# Patient Record
Sex: Female | Born: 1965 | Race: White | Hispanic: No | Marital: Married | Smoking: Former smoker
Health system: Southern US, Community
[De-identification: ages and names within clinical notes are randomized; demographics above are authoritative.]

## PROBLEM LIST (undated history)

## (undated) DIAGNOSIS — E785 Hyperlipidemia, unspecified: Secondary | ICD-10-CM

## (undated) DIAGNOSIS — H409 Unspecified glaucoma: Secondary | ICD-10-CM

## (undated) DIAGNOSIS — Z9889 Other specified postprocedural states: Secondary | ICD-10-CM

## (undated) DIAGNOSIS — R112 Nausea with vomiting, unspecified: Secondary | ICD-10-CM

## (undated) DIAGNOSIS — T8859XA Other complications of anesthesia, initial encounter: Secondary | ICD-10-CM

## (undated) DIAGNOSIS — J069 Acute upper respiratory infection, unspecified: Secondary | ICD-10-CM

## (undated) DIAGNOSIS — T4145XA Adverse effect of unspecified anesthetic, initial encounter: Secondary | ICD-10-CM

## (undated) HISTORY — PX: BREAST BIOPSY: SHX20

## (undated) HISTORY — DX: Unspecified glaucoma: H40.9

## (undated) HISTORY — DX: Acute upper respiratory infection, unspecified: J06.9

## (undated) HISTORY — DX: Hyperlipidemia, unspecified: E78.5

---
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
## 2000-02-22 ENCOUNTER — Encounter: Payer: Self-pay | Admitting: *Deleted

## 2000-02-22 ENCOUNTER — Ambulatory Visit (HOSPITAL_COMMUNITY): Admission: RE | Admit: 2000-02-22 | Discharge: 2000-02-22 | Payer: Self-pay | Admitting: *Deleted

## 2000-05-05 ENCOUNTER — Inpatient Hospital Stay (HOSPITAL_COMMUNITY): Admission: AD | Admit: 2000-05-05 | Discharge: 2000-05-07 | Payer: Self-pay | Admitting: *Deleted

## 2000-06-14 ENCOUNTER — Other Ambulatory Visit: Admission: RE | Admit: 2000-06-14 | Discharge: 2000-06-14 | Payer: Self-pay | Admitting: *Deleted

## 2000-06-23 ENCOUNTER — Encounter (INDEPENDENT_AMBULATORY_CARE_PROVIDER_SITE_OTHER): Payer: Self-pay

## 2000-06-23 ENCOUNTER — Other Ambulatory Visit: Admission: RE | Admit: 2000-06-23 | Discharge: 2000-06-23 | Payer: Self-pay | Admitting: *Deleted

## 2001-07-04 ENCOUNTER — Other Ambulatory Visit: Admission: RE | Admit: 2001-07-04 | Discharge: 2001-07-04 | Payer: Self-pay | Admitting: *Deleted

## 2002-07-31 ENCOUNTER — Other Ambulatory Visit: Admission: RE | Admit: 2002-07-31 | Discharge: 2002-07-31 | Payer: Self-pay | Admitting: *Deleted

## 2002-10-04 ENCOUNTER — Encounter (INDEPENDENT_AMBULATORY_CARE_PROVIDER_SITE_OTHER): Payer: Self-pay

## 2002-10-04 ENCOUNTER — Ambulatory Visit (HOSPITAL_COMMUNITY): Admission: RE | Admit: 2002-10-04 | Discharge: 2002-10-04 | Payer: Self-pay | Admitting: *Deleted

## 2003-06-18 ENCOUNTER — Encounter: Admission: RE | Admit: 2003-06-18 | Discharge: 2003-06-18 | Payer: Self-pay | Admitting: *Deleted

## 2004-01-12 ENCOUNTER — Encounter: Admission: RE | Admit: 2004-01-12 | Discharge: 2004-01-12 | Payer: Self-pay | Admitting: *Deleted

## 2004-08-05 ENCOUNTER — Encounter: Admission: RE | Admit: 2004-08-05 | Discharge: 2004-08-05 | Payer: Self-pay | Admitting: *Deleted

## 2004-11-17 ENCOUNTER — Encounter: Admission: RE | Admit: 2004-11-17 | Discharge: 2004-11-17 | Payer: Self-pay | Admitting: *Deleted

## 2006-03-21 ENCOUNTER — Encounter: Admission: RE | Admit: 2006-03-21 | Discharge: 2006-03-21 | Payer: Self-pay | Admitting: *Deleted

## 2008-02-15 HISTORY — PX: WRIST FRACTURE SURGERY: SHX121

## 2008-04-17 ENCOUNTER — Encounter: Admission: RE | Admit: 2008-04-17 | Discharge: 2008-04-17 | Payer: Self-pay | Admitting: Obstetrics

## 2009-12-17 ENCOUNTER — Encounter: Admission: RE | Admit: 2009-12-17 | Discharge: 2009-12-17 | Payer: Self-pay | Admitting: Obstetrics and Gynecology

## 2009-12-30 ENCOUNTER — Encounter: Admission: RE | Admit: 2009-12-30 | Discharge: 2009-12-30 | Payer: Self-pay | Admitting: Obstetrics and Gynecology

## 2010-03-06 ENCOUNTER — Encounter: Payer: Self-pay | Admitting: *Deleted

## 2010-03-06 ENCOUNTER — Encounter: Payer: Self-pay | Admitting: Obstetrics and Gynecology

## 2010-07-02 NOTE — Op Note (Signed)
NAME:  Brenda Ashley, Brenda Ashley                         ACCOUNT NO.:  [id number]   MEDICAL RECORD NO.:  [id number]                   PATIENT TYPE:  AMB   LOCATION:  SDC                                  FACILITY:  [facility]   PHYSICIAN:   B. Earlene Plater, M.D.               DATE OF BIRTH:  [birth year]   DATE OF PROCEDURE:  10/04/2002  DATE OF DISCHARGE:                                 OPERATIVE REPORT   PREOPERATIVE DIAGNOSIS:  Vulvar carcinoma in situ.   POSTOPERATIVE DIAGNOSIS:  Vulvar carcinoma in situ.   PROCEDURE:  Wide local excision, vulvar lesion, x2 with colposcopy of the  vulva and vagina.   SURGEON:  Chester Holstein. Earlene Plater, M.D.   ANESTHESIA:  LMA.   FINDINGS:  Previous biopsy site on the superior aspect of right labia majora  with a 0.5 cm area of aceto white changes previously confirmed as carcinoma  in site with positive margin on the biopsy.  Left labia minora, inferior  portion, 0.5 cm of aceto white changes.  Both are excised with about a 1 cm  margin.   SPECIMENS:  Vulvar excision x2.   COMPLICATIONS:  None.   ESTIMATED BLOOD LOSS:  Less than 15 mL.   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  At the preop  evaluation was noted to have an additional area in two places on the labia,  one on the left and another on the right which will also be treated today.   PROCEDURE:  The patient was taken to the operating room and LMA anesthesia  obtained. She was placed in the ski position and the perineum covered in  soaked sponges with 4% acetic acid.  After being allowed to sit for about  five minutes colposcopy was performed and two areas of abnormality  identified as outlined above.  A previously noted third area was not seen  today.   Each area was outlined with adequate margin with surgical pen and the  perineum  prepped and draped in standard fashion.  Each site was infiltrated  with about 5 mL of 0.25% Marcaine.   The previously marked area was excised sharply with the knife, made  hemostatic with the Bovie, and reapproximated with interrupted simple  sutures of 4-0 Vicryl.   The patient tolerated the procedure well.  There were no complications.  She  was taken to the recovery room awake, alert, and in stable condition.                                               Gerri Spore B. Earlene Plater, M.D.    WBD/MEDQ  D:  10/04/2002  T:  10/05/2002  Job:  [id number]

## 2010-07-02 NOTE — H&P (Signed)
NAME:  Brenda Ashley, Brenda Ashley                         ACCOUNT NO.:  [id number]   MEDICAL RECORD NO.:  [id number]                   PATIENT TYPE:  AMB   LOCATION:  SDC                                  [facility]:  [facility]   PHYSICIAN:  Tacna B. Earlene Plater, M.D.               DATE OF BIRTH:  07/05/1965   DATE OF ADMISSION:  DATE OF DISCHARGE:                                HISTORY & PHYSICAL   DATE OF PROCEDURE:  October 04, 2002   PREOPERATIVE DIAGNOSIS:  Vulvar carcinoma in situ.   INTENDED PROCEDURE:  Wide local excision of multifocal vulvar neoplasia.   HISTORY OF PRESENT ILLNESS:  A 45 year old white female gravida 2 para 2,  history of VIN 3, has been followed in the office every six months for any  evidence of recurrence.  Most recently seen for annual exam in June 2004.  An area on the right labia majora was subsequently excised, final pathology  showing squamous carcinoma in situ.  The patient returned to the office for  a colposcopy and was found to have multifocal lesions on the right and left  labia minora and therefore presents for wide local excision in the operating  room.   PAST MEDICAL HISTORY:  1. Depression - one episode.  2. Vulvar CIS.  3. Hemorrhoids.   OBSTETRICAL HISTORY:  Vaginal delivery x2.   SURGICAL HISTORY:  1. Multiple wide local excisions of the vulva.  2. Cold knife conization for cervical dysplasia.   MEDICATIONS:  1. Paxil 20 mg one-half tablet daily.  2. Multivitamin.   ALLERGIES:  CODEINE - nausea and vomiting.   SOCIAL HISTORY:  No alcohol, tobacco, or other drugs.   FAMILY HISTORY:  Noncontributory.   REVIEW OF SYSTEMS:  Otherwise negative.   PHYSICAL EXAMINATION:  VITAL SIGNS:  Blood pressure 112/80, pulse 80, weight  118.  GENERAL:  Alert and oriented, no acute distress.  SKIN:  Warm and dry, no lesions.  HEART:  Regular rate and rhythm.  LUNGS:  Clear to auscultation.  ABDOMEN:  Liver and spleen normal, no hernia.  LYMPH NODE SURVEY:   Negative at the neck, axilla, and groin.  PELVIC:  Normal external genitalia other than a 3 mm visible lesion superior  aspect of the right labia majora which was biopsied in the office and was  CIS.  Subsequent colposcopy confirmed this area to be acetowhite.  Also  noted areas just lateral to the previous biopsy site and another between the  vagina and labia minora on the left.   ASSESSMENT:  Multifocal vulvar neoplasia and biopsy-proven vulvar carcinoma  in situ.   PLAN:  Wide local excision of each area of concern with colposcopy.  Operative risks discussed including infection, bleeding, delayed healing.  All questions answered; the patient wished to proceed.  Gerri Spore B. Earlene Plater, M.D.    WBD/MEDQ  D:  10/02/2002  T:  10/02/2002  Job:  [id number]

## 2010-07-02 NOTE — Op Note (Signed)
[facility]  Patient:    Brenda Ashley, Brenda Ashley                      MRN: [id number] Proc. Date: 05/05/00 Adm. Date:  [id number] Attending:  Maxie Better                           Operative Report  PREOPERATIVE DIAGNOSES:       1. Term intrauterine pregnancy.                               2. Maternal exhaustion.  POSTOPERATIVE DIAGNOSES:      1. Term intrauterine pregnancy, delivered.                               2. Maternal exhaustion.  PROCEDURE:                    Low forceps delivery with Luikart forceps.  SURGEON:                      Marina Gravel, M.D.  ANESTHESIA:                   Epidural.  INDICATIONS:                  The patient presented in spontaneous labor on the day of her planned induction.  She progressed with Pitocin augmentation to complete, complete, +3, 5 degree LOA position.  She had pushed for 90 minutes and had been complete for 2-1/2 hours with complaints of exhaustion and requesting assistance.  The risks and benefits were discussed and documented on the chart.  The patient opted for low forceps delivery.  DESCRIPTION OF PROCEDURE:     After adequate epidural anesthesia was obtained, the patient was placed in the dorsal lithotomy position, prepped and draped in the standard fashion.  The bladder was emptied with a red rubber catheter.  The patient was re-examined under anesthesia and again found to be complete, complete, +3, 5 degrees of rotation to LOA position.  Luikart forceps were then applied with ease.  Appropriate placement was confirmed by equidistant spacing from the toe of each forceps blade to the lambdoid suture and by being centered over the sagittal suture in the midline.  With a single traction with the next maternal contraction, excellent descent was noted and the vertex was delivered to the perineum.  The forceps blades were removed and the remainder of the vertex delivered spontaneously.  The nose  and mouth were suctioned with a bulb.  The remainder of the infant was delivered without difficulty.  A posterior arm compound presentation was noted.  This reduced spontaneously.  The cord was clamped and cut and the infant placed on the mothers belly.  Initially, hypotonia was noted.  However, good heart rate.  With stimulation, tone and color improved.  A viable female infant with Apgars of 5 and 8 was delivered at 1957 hours.  After approximately 15 minutes of cord traction and uterine massage, there was no progress with regard to placental expulsion.  Therefore, it was manually extracted.  No problems with uterine atony were observed.  The perineum was noted to have a second degree laceration.  This was repaired in two layers with 2-0 and 3-0  chromic.  The mother and her fetus tolerated the procedure well.  There were no complications.  There was no apparent injury to the fetus. DD:  05/05/00 TD:  05/08/00 Job: [id number]

## 2010-07-02 NOTE — H&P (Signed)
[facility]  Patient:    Brenda Ashley, Brenda Ashley                        MRN: Adm. Date:  05/05/00 Attending:  Marina Gravel, M.D.                         History and Physical  SOCIAL SECURITY NUMBER:       [id number]  DATE OF BIRTH:                12/29/1965  CHIEF COMPLAINT:              Induction of labor.  HISTORY OF PRESENT ILLNESS:   A 45 year old white female gravida 2 para 1 at 5 and three-sevenths weeks admitted with favorable cervix for induction of labor at term.  Prenatal care at [facility], Dr. Earlene Plater, uncomplicated. Has a history of cervical dysplasia and vulvar neoplasia.  Status post conization in 1997 and history of multiple wide local excisions of the vulva. Patient transferred her care to [facility] at 27 weeks after moving from [place].  Prior to that point she had had a 17-week ultrasound which confirmed her dates and had declined AFP.  PAST OBSTETRICAL HISTORY:     A 39-week SVD, no complications, but did have history of postpartum depression.  PAST MEDICAL HISTORY:         None.  PAST SURGICAL HISTORY:        Cold-knife conization for cervical high-grade dysplasia.  History of vulvar excision for neoplasia x 2.  ALLERGIES:                    None.  MEDICATIONS:                  Prenatal vitamins.  PRENATAL LABORATORY:          O positive.  Rubella immune.  RPR and hepatitis B negative.  AFP declined.  HIV declined.  Group B strep negative.  DATING CRITERIA:              Last menstrual period Jul 11, 1999 with [facility] April 16, 2000.  EDC changed based on 17-week ultrasound to April 30, 2000.  PHYSICAL EXAMINATION:  VITAL SIGNS:                  Weight 145, blood pressure 116/70, fetal heart rate 136.  GENERAL:                      The patient is alert and oriented in no acute distress.  SKIN:                         Warm and dry, no lesions.  HEART:                        Regular rate and rhythm.  LUNGS:                         Clear to auscultation.  ABDOMEN:                      Gravid,  fundal height 36.  PELVIC:  Normal external female genitalia.  Scarring from previous vulvar excisions noted.  Cervix is 3 cm dilated, 50% effaced, -1 station, vertex presentation.  IMAGING:                      Ultrasound April 20, 2000 with estimated fetal weight 3217 g, 38th percentile, with normal AFI.  REVIEW OF SYSTEMS:            Otherwise negative.  ASSESSMENT:                   Term intrauterine pregnancy, history of previous SVD, with favorable cervix.  PLAN:                         Admit for artificial rupture of membranes and anticipate spontaneous vaginal delivery. DD:  05/02/00 TD:  05/03/00 Job: [id number]

## 2010-12-13 ENCOUNTER — Other Ambulatory Visit: Payer: Self-pay | Admitting: Obstetrics and Gynecology

## 2010-12-13 DIAGNOSIS — Z1231 Encounter for screening mammogram for malignant neoplasm of breast: Secondary | ICD-10-CM

## 2011-01-07 ENCOUNTER — Ambulatory Visit
Admission: RE | Admit: 2011-01-07 | Discharge: 2011-01-07 | Disposition: A | Discharge status: Home / Self Care | Payer: 59 | Source: Ambulatory Visit | Attending: Obstetrics and Gynecology | Admitting: Obstetrics and Gynecology

## 2011-01-07 DIAGNOSIS — Z1231 Encounter for screening mammogram for malignant neoplasm of breast: Secondary | ICD-10-CM

## 2012-01-19 ENCOUNTER — Other Ambulatory Visit: Payer: Self-pay

## 2012-01-26 ENCOUNTER — Other Ambulatory Visit: Payer: Self-pay

## 2012-01-31 ENCOUNTER — Other Ambulatory Visit: Payer: Self-pay | Admitting: Obstetrics and Gynecology

## 2012-01-31 DIAGNOSIS — Z1231 Encounter for screening mammogram for malignant neoplasm of breast: Secondary | ICD-10-CM

## 2012-03-08 ENCOUNTER — Ambulatory Visit
Admission: RE | Admit: 2012-03-08 | Discharge: 2012-03-08 | Disposition: A | Discharge status: Home / Self Care | Payer: 59 | Source: Ambulatory Visit | Attending: Obstetrics and Gynecology | Admitting: Obstetrics and Gynecology

## 2012-03-08 DIAGNOSIS — Z1231 Encounter for screening mammogram for malignant neoplasm of breast: Secondary | ICD-10-CM

## 2013-03-12 ENCOUNTER — Other Ambulatory Visit: Payer: Self-pay

## 2013-03-12 DIAGNOSIS — Z1231 Encounter for screening mammogram for malignant neoplasm of breast: Secondary | ICD-10-CM

## 2013-04-01 ENCOUNTER — Ambulatory Visit
Admission: RE | Admit: 2013-04-01 | Discharge: 2013-04-01 | Disposition: A | Discharge status: Home / Self Care | Payer: Private Health Insurance - Indemnity | Source: Ambulatory Visit

## 2013-04-01 DIAGNOSIS — Z1231 Encounter for screening mammogram for malignant neoplasm of breast: Secondary | ICD-10-CM

## 2013-04-03 ENCOUNTER — Other Ambulatory Visit: Payer: Self-pay | Admitting: Obstetrics and Gynecology

## 2013-04-03 DIAGNOSIS — R928 Other abnormal and inconclusive findings on diagnostic imaging of breast: Secondary | ICD-10-CM

## 2013-04-18 ENCOUNTER — Ambulatory Visit
Admission: RE | Admit: 2013-04-18 | Discharge: 2013-04-18 | Disposition: A | Discharge status: Home / Self Care | Payer: Private Health Insurance - Indemnity | Source: Ambulatory Visit | Attending: Obstetrics and Gynecology | Admitting: Obstetrics and Gynecology

## 2013-04-18 DIAGNOSIS — R928 Other abnormal and inconclusive findings on diagnostic imaging of breast: Secondary | ICD-10-CM

## 2013-04-26 ENCOUNTER — Other Ambulatory Visit: Payer: Self-pay

## 2013-11-06 ENCOUNTER — Other Ambulatory Visit: Payer: Self-pay | Admitting: Obstetrics and Gynecology

## 2013-11-06 DIAGNOSIS — R921 Mammographic calcification found on diagnostic imaging of breast: Secondary | ICD-10-CM

## 2013-11-13 ENCOUNTER — Ambulatory Visit
Admission: RE | Admit: 2013-11-13 | Discharge: 2013-11-13 | Disposition: A | Discharge status: Home / Self Care | Payer: Managed Care, Other (non HMO) | Source: Ambulatory Visit | Attending: Obstetrics and Gynecology | Admitting: Obstetrics and Gynecology

## 2013-11-13 DIAGNOSIS — R921 Mammographic calcification found on diagnostic imaging of breast: Secondary | ICD-10-CM

## 2014-05-05 ENCOUNTER — Other Ambulatory Visit: Payer: Self-pay | Admitting: Obstetrics and Gynecology

## 2014-05-05 DIAGNOSIS — R921 Mammographic calcification found on diagnostic imaging of breast: Secondary | ICD-10-CM

## 2014-05-09 ENCOUNTER — Ambulatory Visit
Admission: RE | Admit: 2014-05-09 | Discharge: 2014-05-09 | Disposition: A | Discharge status: Home / Self Care | Payer: Managed Care, Other (non HMO) | Source: Ambulatory Visit | Attending: Obstetrics and Gynecology | Admitting: Obstetrics and Gynecology

## 2014-05-09 DIAGNOSIS — R921 Mammographic calcification found on diagnostic imaging of breast: Secondary | ICD-10-CM

## 2015-07-08 ENCOUNTER — Other Ambulatory Visit: Payer: Self-pay | Admitting: Family Medicine

## 2015-07-08 ENCOUNTER — Other Ambulatory Visit: Payer: Self-pay | Admitting: Obstetrics and Gynecology

## 2015-07-08 DIAGNOSIS — R921 Mammographic calcification found on diagnostic imaging of breast: Secondary | ICD-10-CM

## 2015-07-21 ENCOUNTER — Ambulatory Visit
Admission: RE | Admit: 2015-07-21 | Discharge: 2015-07-21 | Disposition: A | Discharge status: Home / Self Care | Payer: Managed Care, Other (non HMO) | Source: Ambulatory Visit | Attending: Internal Medicine | Admitting: Internal Medicine

## 2015-07-21 DIAGNOSIS — R921 Mammographic calcification found on diagnostic imaging of breast: Secondary | ICD-10-CM

## 2016-08-09 ENCOUNTER — Other Ambulatory Visit: Payer: Self-pay | Admitting: Obstetrics and Gynecology

## 2016-08-09 DIAGNOSIS — Z1231 Encounter for screening mammogram for malignant neoplasm of breast: Secondary | ICD-10-CM

## 2016-08-24 ENCOUNTER — Ambulatory Visit
Admission: RE | Admit: 2016-08-24 | Discharge: 2016-08-24 | Disposition: A | Discharge status: Home / Self Care | Payer: Managed Care, Other (non HMO) | Source: Ambulatory Visit | Attending: Obstetrics and Gynecology | Admitting: Obstetrics and Gynecology

## 2016-08-24 DIAGNOSIS — Z1231 Encounter for screening mammogram for malignant neoplasm of breast: Secondary | ICD-10-CM

## 2016-08-25 ENCOUNTER — Other Ambulatory Visit: Payer: Self-pay | Admitting: Obstetrics and Gynecology

## 2016-08-25 DIAGNOSIS — R928 Other abnormal and inconclusive findings on diagnostic imaging of breast: Secondary | ICD-10-CM

## 2016-08-30 ENCOUNTER — Ambulatory Visit
Admission: RE | Admit: 2016-08-30 | Discharge: 2016-08-30 | Disposition: A | Discharge status: Home / Self Care | Payer: Managed Care, Other (non HMO) | Source: Ambulatory Visit | Attending: Obstetrics and Gynecology | Admitting: Obstetrics and Gynecology

## 2016-08-30 DIAGNOSIS — R928 Other abnormal and inconclusive findings on diagnostic imaging of breast: Secondary | ICD-10-CM

## 2017-02-14 HISTORY — PX: VULVA SURGERY: SHX837

## 2017-04-26 ENCOUNTER — Encounter: Payer: Self-pay | Admitting: Family Medicine

## 2017-04-26 ENCOUNTER — Ambulatory Visit (INDEPENDENT_AMBULATORY_CARE_PROVIDER_SITE_OTHER): Payer: 59 | Admitting: Family Medicine

## 2017-04-26 VITALS — BP 134/84 | HR 76 | Ht 62.0 in | Wt 134.9 lb

## 2017-04-26 DIAGNOSIS — Z1212 Encounter for screening for malignant neoplasm of rectum: Secondary | ICD-10-CM

## 2017-04-26 DIAGNOSIS — Z85820 Personal history of malignant melanoma of skin: Secondary | ICD-10-CM

## 2017-04-26 DIAGNOSIS — Z723 Lack of physical exercise: Secondary | ICD-10-CM | POA: Diagnosis not present

## 2017-04-26 DIAGNOSIS — Z87891 Personal history of nicotine dependence: Secondary | ICD-10-CM | POA: Diagnosis not present

## 2017-04-26 DIAGNOSIS — Z8249 Family history of ischemic heart disease and other diseases of the circulatory system: Secondary | ICD-10-CM | POA: Insufficient documentation

## 2017-04-26 DIAGNOSIS — Z1211 Encounter for screening for malignant neoplasm of colon: Secondary | ICD-10-CM

## 2017-04-26 DIAGNOSIS — Z83438 Family history of other disorder of lipoprotein metabolism and other lipidemia: Secondary | ICD-10-CM | POA: Insufficient documentation

## 2017-04-26 DIAGNOSIS — Z811 Family history of alcohol abuse and dependence: Secondary | ICD-10-CM | POA: Insufficient documentation

## 2017-04-26 DIAGNOSIS — Z830 Family history of human immunodeficiency virus [HIV] disease: Secondary | ICD-10-CM | POA: Insufficient documentation

## 2017-04-26 DIAGNOSIS — Z833 Family history of diabetes mellitus: Secondary | ICD-10-CM

## 2017-04-26 DIAGNOSIS — Z818 Family history of other mental and behavioral disorders: Secondary | ICD-10-CM | POA: Insufficient documentation

## 2017-04-26 DIAGNOSIS — Z831 Family history of other infectious and parasitic diseases: Secondary | ICD-10-CM

## 2017-04-26 DIAGNOSIS — Z801 Family history of malignant neoplasm of trachea, bronchus and lung: Secondary | ICD-10-CM | POA: Insufficient documentation

## 2017-04-26 DIAGNOSIS — Z975 Presence of (intrauterine) contraceptive device: Secondary | ICD-10-CM | POA: Insufficient documentation

## 2017-04-26 NOTE — Progress Notes (Signed)
New patient office visit note:  Impression and Recommendations:    1. History of melanoma-sees Derm yearly   2. History of smoking 10 pack years   3. Screening for colorectal cancer   4. FHx: early coronary artery disease   5. Family history of diabetes mellitus (DM)-brother,   6. Family history of HIV infection- BROTHER   7. Family history of combined hyperlipidemia   8. Family history of hypertension   9. Inactivity    -Pt encouraged to exercise 30 minutes daily.  -Check labs. Pt prefers to go over results in person.  -Referral given for colonoscopy as this is not UTD.   -mediterranean diet discussed. Eat more lean proteins like chicken, Malawiturkey, and fish. Limit red meat to 1 time per week. Eat less processed foods and eat more foods that grow out of the ground, and out of a tree or bush.    Education and routine counseling performed. Handouts provided.  Orders Placed This Encounter  Procedures  . CBC with Differential/Platelet  . Comprehensive metabolic panel  . Hemoglobin A1c  . Lipid panel  . VITAMIN D 25 Hydroxy (Vit-D Deficiency, Fractures)  . T3, free  . TSH  . T4, free  . Ambulatory referral to Gastroenterology    Gross side effects, risk and benefits, and alternatives of medications discussed with patient.  Patient is aware that all medications have potential side effects and we are unable to predict every side effect or drug-drug interaction that may occur.  Expresses verbal understanding and consents to current therapy plan and treatment regimen.  Return for Chronic OV w me near future & FBW 2-3d prior.  Patient desires to come back and discuss results with me    Please see AVS handed out to patient at the end of our visit for further patient instructions/ counseling done pertaining to today's office visit.    Note: This document was prepared using Dragon voice recognition software and may include unintentional dictation errors.   Pt was in the  office today for 60+ minutes, with over 50% time spent in face to face counseling of patients various medical conditions, treatment plans of those medical conditions including medicine management and lifestyle modification, strategies to improve health and well being; and in coordination of care. SEE ABOVE FOR DETAILS  This document serves as a record of services personally performed by [name], DO. It was created on her behalf by Thelma BargeNick Cochran, a trained medical scribe. The creation of this record is based on the scribe's personal observations and the provider's statements to them.   I have reviewed the above medical documentation for accuracy and completeness and I concur.  [name] 04/26/17 7:19 PM  ----------------------------------------------------------------------------------------------------------------------    Subjective:    Chief complaint:   Chief Complaint  Patient presents with  . Establish Care     HPI: Brenda Ashley is a pleasant 52 y.o. female who [facility] to review their medical history with me and establish care.   I asked the patient to review their chronic problem list with me to ensure everything was updated and accurate.    All recent office visits with other providers, any medical records that patient brought in etc  - I reviewed today.     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  Told them to call their specialists to clarify this if they are not sure.   Personal information She has lived in [place] her whole life despite being born in [place].  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   She has 1 daughter age 37, and 1 son age 53, freshman at [employer]  in math.   Exercise:  she does not exercise recently, but she has a new elliptical being sent to her house next week. She tries to walk 10,000 steps/day.   Diet: She is working on her diet slowly  Other providers She has not had a PCP before. A friend of hers was referring her to come to the clinic here because she was looking for a holistic head-to-toe doctor.   OBGYN- Dr. Clovis Pu, [place], [place]- they have been doing screenings and blood works. She is with [facility]- [facility]  PMHx Mirena IUD placed through GYN.  Melanoma on back- over 5 years ago, 2013 (which she had removed)- which she had chest Xray and other imaging with WNL results.  No h/o DM, HLD, HTN etc. to her knowledge.  Colonoscopy not UTD- she states she gets sick easily and is worried about the preparation.  FMHx Mother - Lung CA, age early 52s- not a daily smoker (but second hand exposure) Father - MI, age early 33s- he committed suicide 80. Brother died of AIDS 56 (age 55) Paternal uncle - MI, age  Older brother- DM2 age 33 onset, alcoholism, Below knee amputation as a result of traumatic injury.  HLD- Brother, father   PSHx   SHx She quit smoking in 1993 and smoked for 10 years (~1 pack/day). She drinks socially 1 drink per month.   Wt Readings from Last 3 Encounters:  04/26/17 134 lb 14.4 oz (61.2 kg)   BP Readings from Last 3 Encounters:  04/26/17 134/84   Pulse Readings from Last 3 Encounters:  04/26/17 76   BMI Readings from Last 3 Encounters:  04/26/17 24.67 kg/m    Patient Care Team    Relationship Specialty Notifications Start End  Thomasene Lot, DO PCP - General Family Medicine  04/26/17   Bufford Buttner, MD Referring Physician Dermatology  04/26/17   Ob/Gyn, Nestor Ramp    04/26/17 04/26/17   Comment: Has GYN Dr. at [facility] gynecology :   Dr.  Francene Finders, Dewitt Rota, MD  Obstetrics and Gynecology  04/26/17     Patient  Active Problem List   Diagnosis Date Noted  . History of melanoma-sees Derm yearly 04/26/2017  . History of smoking 10 pack years 04/26/2017  . Family history of lung cancer-in non-smoker 04/26/2017  . FHx: early coronary artery disease 04/26/2017  . Family history of suicide- FATHER 04/26/2017  . Family history of diabetes mellitus (DM)-brother, 04/26/2017  . Family history of HIV infection- BROTHER 04/26/2017  . Family history of combined hyperlipidemia 04/26/2017  . Family history of hypertension 04/26/2017  . IUD (intrauterine device) in place-Per GYN 04/26/2017  . Inactivity 04/26/2017  . Family history of alcoholism 04/26/2017     History reviewed. No pertinent past medical history.   History reviewed. No pertinent past medical history.   Past Surgical History:  Procedure Laterality Date  . WRIST FRACTURE SURGERY  2010     Family History  Problem Relation Age of Onset  . Breast cancer Maternal Aunt   .  Breast cancer Cousin   . Lung cancer Mother   . Heart attack Father   . Depression Father   . Hyperlipidemia Father   . Diabetes Brother   . Hyperlipidemia Brother      Social History   Substance and Sexual Activity  Drug Use No     Social History   Substance and Sexual Activity  Alcohol Use Yes   Comment: 1/ month     Social History   Tobacco Use  Smoking Status Former Smoker  . Packs/day: 0.50  . Years: 15.00  . Pack years: 7.50  . Types: Cigarettes  . Last attempt to quit: 1993  . Years since quitting: 26.2  Smokeless Tobacco Never Used     No outpatient medications have been marked as taking for the 04/26/17 encounter (Office Visit) with Thomasene Lot, DO.    Allergies: Patient has no known allergies.   Review of Systems  Constitutional: Negative for chills, diaphoresis, fever, malaise/fatigue and weight loss.  HENT: Negative for congestion, sore throat and tinnitus.   Eyes: Negative for blurred vision, double vision and  photophobia.  Respiratory: Negative for cough and wheezing.   Cardiovascular: Negative for chest pain and palpitations.  Gastrointestinal: Negative for blood in stool, diarrhea, nausea and vomiting.  Genitourinary: Negative for dysuria, frequency and urgency.  Musculoskeletal: Negative for joint pain and myalgias.  Skin: Negative for itching and rash.  Neurological: Negative for dizziness, focal weakness, weakness and headaches.  Endo/Heme/Allergies: Negative for environmental allergies and polydipsia. Does not bruise/bleed easily.  Psychiatric/Behavioral: Negative for depression and memory loss. The patient is not nervous/anxious and does not have insomnia.      Objective:   Blood pressure 134/84, pulse 76, height 5\' 2"  (1.575 m), weight 134 lb 14.4 oz (61.2 kg), SpO2 100 %. Body mass index is 24.67 kg/m. General: Well Developed, well nourished, and in no acute distress.  Neuro: Alert and oriented x3, extra-ocular muscles intact, sensation grossly intact.  HEENT:[place]/AT, PERRLA, neck supple, No carotid bruits Skin: no gross rashes  Cardiac: Regular rate and rhythm Respiratory: Essentially clear to auscultation bilaterally. Not using accessory muscles, speaking in full sentences.  Abdominal: not grossly distended Musculoskeletal: Ambulates w/o diff, FROM * 4 ext.  Vasc: less 2 sec cap RF, warm and pink  Psych:  No HI/SI, judgement and insight good, Euthymic mood. Full Affect.    No results found for this or any previous visit (from the past 2160 hour(s)).

## 2017-04-26 NOTE — Patient Instructions (Signed)

## 2017-04-28 ENCOUNTER — Other Ambulatory Visit (INDEPENDENT_AMBULATORY_CARE_PROVIDER_SITE_OTHER): Payer: 59

## 2017-04-28 DIAGNOSIS — Z87891 Personal history of nicotine dependence: Secondary | ICD-10-CM

## 2017-04-28 DIAGNOSIS — Z1212 Encounter for screening for malignant neoplasm of rectum: Secondary | ICD-10-CM

## 2017-04-28 DIAGNOSIS — Z1211 Encounter for screening for malignant neoplasm of colon: Secondary | ICD-10-CM

## 2017-04-28 DIAGNOSIS — Z85820 Personal history of malignant melanoma of skin: Secondary | ICD-10-CM

## 2017-04-29 LAB — CBC WITH DIFFERENTIAL/PLATELET
BASOS: 0 %
Basophils Absolute: 0 10*3/uL (ref 0.0–0.2)
EOS (ABSOLUTE): 0 10*3/uL (ref 0.0–0.4)
EOS: 1 %
HEMATOCRIT: 40.2 % (ref 34.0–46.6)
Hemoglobin: 14.4 g/dL (ref 11.1–15.9)
IMMATURE GRANS (ABS): 0 10*3/uL (ref 0.0–0.1)
Immature Granulocytes: 0 %
Lymphocytes Absolute: 2.1 10*3/uL (ref 0.7–3.1)
Lymphs: 38 %
MCH: 32.4 pg (ref 26.6–33.0)
MCHC: 35.8 g/dL — ABNORMAL HIGH (ref 31.5–35.7)
MCV: 91 fL (ref 79–97)
MONOS ABS: 0.6 10*3/uL (ref 0.1–0.9)
Monocytes: 11 %
NEUTROS ABS: 2.8 10*3/uL (ref 1.4–7.0)
Neutrophils: 50 %
PLATELETS: 207 10*3/uL (ref 150–379)
RBC: 4.44 x10E6/uL (ref 3.77–5.28)
RDW: 12.6 % (ref 12.3–15.4)
WBC: 5.5 10*3/uL (ref 3.4–10.8)

## 2017-04-29 LAB — COMPREHENSIVE METABOLIC PANEL
A/G RATIO: 1.9 (ref 1.2–2.2)
ALBUMIN: 4.5 g/dL (ref 3.5–5.5)
ALT: 9 IU/L (ref 0–32)
AST: 17 IU/L (ref 0–40)
Alkaline Phosphatase: 54 IU/L (ref 39–117)
BUN/Creatinine Ratio: 21 (ref 9–23)
BUN: 14 mg/dL (ref 6–24)
Bilirubin Total: 0.8 mg/dL (ref 0.0–1.2)
CALCIUM: 9 mg/dL (ref 8.7–10.2)
CO2: 24 mmol/L (ref 20–29)
Chloride: 104 mmol/L (ref 96–106)
Creatinine, Ser: 0.68 mg/dL (ref 0.57–1.00)
GFR, EST AFRICAN AMERICAN: 117 mL/min/{1.73_m2} (ref >59)
GFR, EST NON AFRICAN AMERICAN: 102 mL/min/{1.73_m2} (ref >59)
GLOBULIN, TOTAL: 2.4 g/dL (ref 1.5–4.5)
Glucose: 90 mg/dL (ref 65–99)
POTASSIUM: 4.7 mmol/L (ref 3.5–5.2)
SODIUM: 142 mmol/L (ref 134–144)
TOTAL PROTEIN: 6.9 g/dL (ref 6.0–8.5)

## 2017-04-29 LAB — LIPID PANEL
CHOLESTEROL TOTAL: 181 mg/dL (ref 100–199)
Chol/HDL Ratio: 2.6 ratio (ref 0.0–4.4)
HDL: 70 mg/dL (ref >39)
LDL Calculated: 102 mg/dL — ABNORMAL HIGH (ref 0–99)
TRIGLYCERIDES: 43 mg/dL (ref 0–149)
VLDL Cholesterol Cal: 9 mg/dL (ref 5–40)

## 2017-04-29 LAB — HEMOGLOBIN A1C
Est. average glucose Bld gHb Est-mCnc: 103 mg/dL
Hgb A1c MFr Bld: 5.2 % (ref 4.8–5.6)

## 2017-04-29 LAB — VITAMIN D 25 HYDROXY (VIT D DEFICIENCY, FRACTURES): VIT D 25 HYDROXY: 24 ng/mL — AB (ref 30.0–100.0)

## 2017-04-29 LAB — T4, FREE: Free T4: 1.22 ng/dL (ref 0.82–1.77)

## 2017-04-29 LAB — T3, FREE: T3, Free: 2.9 pg/mL (ref 2.0–4.4)

## 2017-04-29 LAB — TSH: TSH: 1.11 u[IU]/mL (ref 0.450–4.500)

## 2017-05-01 ENCOUNTER — Encounter: Payer: Self-pay | Admitting: Family Medicine

## 2017-05-01 ENCOUNTER — Ambulatory Visit (INDEPENDENT_AMBULATORY_CARE_PROVIDER_SITE_OTHER): Payer: 59 | Admitting: Family Medicine

## 2017-05-01 VITALS — BP 131/86 | HR 66 | Ht 62.0 in | Wt 136.0 lb

## 2017-05-01 DIAGNOSIS — E559 Vitamin D deficiency, unspecified: Secondary | ICD-10-CM | POA: Diagnosis not present

## 2017-05-01 MED ORDER — VITAMIN D3 125 MCG (5000 UT) PO TABS: ORAL_TABLET | ORAL | 3 refills | Status: AC

## 2017-05-01 NOTE — Patient Instructions (Addendum)
Please contact your medical insurance and ask about if they do any kind of screening or early screening for lung cancer with your family history.  Asked him if there is any conditions where it would be covered for us to get a CT scan low-dose to screen you early.      Guidelines for a Low Cholesterol, Low Saturated Fat Diet   Fats - Limit total intake of fats and oils. - Avoid butter, stick margarine, shortening, lard, palm and coconut oils. - Limit mayonnaise, salad dressings, gravies and sauces, unless they are homemade with low-fat ingredients. - Limit chocolate. - Choose low-fat and nonfat products, such as low-fat mayonnaise, low-fat or non-hydrogenated peanut butter, low-fat or fat-free salad dressings and nonfat gravy. - Use vegetable oil, such as canola or olive oil. - Look for margarine that does not contain trans fatty acids. - Use nuts in moderate amounts. - Read ingredient labels carefully to determine both amount and type of fat present in foods. Limit saturated and trans fats! - Avoid high-fat processed and convenience foods.  Meats and Meat Alternatives - Choose fish, chicken, Malawiturkey and lean meats. - Use dried beans, peas, lentils and tofu. - Limit egg yolks to three to four per week. - If you eat red meat, limit to no more than three servings per week and choose loin or round cuts. - Avoid fatty meats, such as bacon, sausage, franks, luncheon meats and ribs. - Avoid all organ meats, including liver.  Dairy - Choose nonfat or low-fat milk, yogurt and cottage cheese. - Most cheeses are high in fat. Choose cheeses made from non-fat milk, such as mozzarella and ricotta cheese. - Choose light or fat-free cream cheese and sour cream. - Avoid cream and sauces made with cream.  Fruits and Vegetables - Eat a wide variety of fruits and vegetables. - Use lemon juice, vinegar or "mist" olive oil on vegetables. - Avoid adding sauces, fat or oil to vegetables.  Breads,  Cereals and Grains - Choose whole-grain breads, cereals, pastas and rice. - Avoid high-fat snack foods, such as granola, cookies, pies, pastries, doughnuts and croissants.  Cooking Tips - Avoid deep fried foods. - Trim visible fat off meats and remove skin from poultry before cooking. - Bake, broil, boil, poach or roast poultry, fish and lean meats. - Drain and discard fat that drains out of meat as you cook it. - Add little or no fat to foods. - Use vegetable oil sprays to grease pans for cooking or baking. - Steam vegetables. - Use herbs or no-oil marinades to flavor foods.

## 2017-05-01 NOTE — Progress Notes (Signed)
Assessment and plan:  1. Vitamin D deficiency    1. Lab work reviewed in detail with the patient today.  1. Lab Review  Blood Counts - White blood cells look good. - Hemoglobin hematocrit look good. - Platelet counts look good. - Premature and mature blood cells look good.  Thyroid - T3 and T4, and TSH all look good.  Vitamin D - Low at 24.0 - advised to be in the 50-60 range. - Reviewed that low Vitamin D can cause feeling tired, sluggish, depressed mood, achy muscles & joints. - Reviewed to eat things high in Vitamin D (fortified foods), or begin Vitamin D tablet. - Patient willing to begin taking Vitamin D OTC or by prescription. - Patient notes more easily remembering to take pills daily - 5000 IU's daily. - Re-check Vitamin D in 4-6 months.  Cholesterol - Reviewed the difference between good and bad cholesterol - HDL and LDL. - HDL greater than 60 is good, and her value is 70. - Reviewed that exercise can contribute to increasing HDL.  - Triglycerides = 43. - LDL is 102, near perfect being 99 or less. The 10-year ASCVD risk score Denman George DC Montez Hageman., et al., 2013) is: 0.9%   Values used to calculate the score:     Age: 54 years     Sex: Female     Is Non-Hispanic African American: No     Diabetic: No     Tobacco smoker: No     Systolic Blood Pressure: 131 mmHg     Is BP treated: No     HDL Cholesterol: 70 mg/dL     Total Cholesterol: 181 mg/dL  - Reviewed that this is a very low risk for stroke or CVA.  - Cutting back on saturated/trans fat intake, and exercising more, will help reduce this risk.  - Informational handout on cholesterol provided today.  A1C - Today is 5.2 - which we reviewed is a good number.  Kidney Function - Reviewed that the glucose reading in her lab is based on a single instant in time, the glucose she ate the night before.  Her sugar was at 90, which is under 100, which is good.  - Serum creatinine  is good.  - Reviewed critical importance of hydration in the organs, especially the kidneys.  Electrolytes  - All look fantastic.  Liver Function - Liver enzymes perfect.  2. Vitamin D Deficiency - Began Vitamin D supplementation - 5000 IU's daily.  3. General Health Maintenance Explained to patient what BMI refers to, and what it means medically.    Told patient to think about it as a "medical risk stratification measurement" and how increasing BMI is associated with increasing risk/ or worsening state of various diseases such as hypertension, hyperlipidemia, diabetes, premature OA, depression etc.  American Heart Association guidelines for healthy diet, basically Mediterranean diet, and exercise guidelines of 30 minutes 5 days per week or more discussed in detail.  Health counseling performed.  All questions answered.  - Advised patient to continue working toward exercising to improve health.    - Patient may begin with 15 minutes of activity daily.  Recommended that the patient eventually strive for at least 150 minutes of cardio per week according to guidelines established by the [facility].   - Healthy dietary habits encouraged, including low-carb, and high amounts of lean protein in diet.   - Patient should also consume adequate amounts of water - half of body  weight in oz of water per day  4. Follow-Up - Patient plans to begin using her elliptical machine, eating less carbs, drinking more water, and working on her general health maintenance.  - Patient desires a CT scan of her lung.  Advised to call insurance and describe family history.  Explain the circumstances and ask if there is any condition in which insurance would pay for her early screening CT scan.  She could also call around and ask what the cash price would be for a low-dose CT scan of the lungs.  - Later on down the line, we can refer the patient to pulmonology.  Sit for 15-20 minutes quietly with her feet on the  ground, not had caffeine or anything spicy, or a fight that got her fired up.  Then check blood pressure.  Goal is 120/80 or less, and if it remains up, she will return for follow-up sooner.  Exercising more, cutting back on salt, drinking more water, etc, all will make her blood pressure go down.  - Return in 6 months for a physical exam.   Education and routine counseling performed. Handouts provided.   Meds ordered this encounter  Medications  . Cholecalciferol (VITAMIN D3) 5000 units TABS    Sig: 5,000 IU OTC vitamin D3 daily.    Dispense:  90 tablet    Refill:  3     Return in about 6 months (around 11/01/2017) for physical exam- 73mo.   Anticipatory guidance and routine counseling done re: condition, txmnt options and need for follow up. All questions of patient's were answered.   Gross side effects, risk and benefits, and alternatives of medications discussed with patient.  Patient is aware that all medications have potential side effects and we are unable to predict every sideeffect or drug-drug interaction that may occur.  Expresses verbal understanding and consents to current therapy plan and treatment regiment.  Please see AVS handed out to patient at the end of our visit for additional patient instructions/ counseling done pertaining to today's office visit.  Note: This document was prepared using Dragon voice recognition software and may include unintentional dictation errors.    This document serves as a record of services personally performed by Thomasene Loteborah Quintasha Gren, DO. It was created on her behalf by Peggye FothergillKatherine Galloway, a trained medical scribe. The creation of this record is based on the scribe's personal observations and the provider's statements to them.   I have reviewed the above medical documentation for accuracy and completeness and I concur.  Thomasene LotDeborah Chayton Murata 05/08/17 5:16  PM   ----------------------------------------------------------------------------------------------------------------------  Subjective:   CC:   Brenda Ashley is a 52 y.o. female who [facility] for review and discussion of recent bloodwork that was done.  1. All recent blood work that we ordered was reviewed with patient today.  Patient was counseled on all abnormalities and we discussed dietary and lifestyle changes that could help those values (also medications when appropriate).  Extensive health counseling performed and all patient's concerns/ questions were addressed.   She has not reviewed her labs prior to her appointment today. Today's appointment was spent counseling and advising her based on her lab work. This information was largely based on results, impressions, and assessments.  With regards to her low Vitamin D, notes that she does feel tired lately.  Avoids exposure to sunlight given her past history of melanoma.  Patient would like to evaluate her eligibility for an early  CT screen for lung cancer given her family history.   Wt Readings from Last 3 Encounters:  05/01/17 136 lb (61.7 kg)  04/26/17 134 lb 14.4 oz (61.2 kg)   BP Readings from Last 3 Encounters:  05/01/17 131/86  04/26/17 134/84   Pulse Readings from Last 3 Encounters:  05/01/17 66  04/26/17 76   BMI Readings from Last 3 Encounters:  05/01/17 24.87 kg/m  04/26/17 24.67 kg/m     Patient Care Team    Relationship Specialty Notifications Start End  Thomasene Lot, DO PCP - General Family Medicine  04/26/17   Bufford Buttner, MD Referring Physician Dermatology  04/26/17   Bobette Mo, MD  Obstetrics and Gynecology  04/26/17     Full medical history updated and reviewed in the office today  Patient Active Problem List   Diagnosis Date Noted  . Vitamin D deficiency 05/01/2017  . History of melanoma-sees Derm yearly 04/26/2017  .  History of smoking 10 pack years 04/26/2017  . Family history of lung cancer-in non-smoker 04/26/2017  . FHx: early coronary artery disease 04/26/2017  . Family history of suicide- FATHER 04/26/2017  . Family history of diabetes mellitus (DM)-brother, 04/26/2017  . Family history of HIV infection- BROTHER 04/26/2017  . Family history of combined hyperlipidemia 04/26/2017  . Family history of hypertension 04/26/2017  . IUD (intrauterine device) in place-Per GYN 04/26/2017  . Inactivity 04/26/2017  . Family history of alcoholism 04/26/2017    No past medical history on file.  Past Surgical History:  Procedure Laterality Date  . WRIST FRACTURE SURGERY  2010    Social History   Tobacco Use  . Smoking status: Former Smoker    Packs/day: 0.50    Years: 15.00    Pack years: 7.50    Types: Cigarettes    Last attempt to quit: 1993    Years since quitting: 26.2  . Smokeless tobacco: Never Used  Substance Use Topics  . Alcohol use: Yes    Comment: 1/ month    Family Hx: Family History  Problem Relation Age of Onset  . Breast cancer Maternal Aunt   . Breast cancer Cousin   . Lung cancer Mother   . Heart attack Father   . Depression Father   . Hyperlipidemia Father   . Diabetes Brother   . Hyperlipidemia Brother      Medications: Current Outpatient Medications  Medication Sig Dispense Refill  . Cholecalciferol (VITAMIN D3) 5000 units TABS 5,000 IU OTC vitamin D3 daily. 90 tablet 3   No current facility-administered medications for this visit.     Allergies:  No Known Allergies   Review of Systems: General:   No F/C, wt loss Pulm:   No DIB, SOB, pleuritic chest pain Card:  No CP, palpitations Abd:  No n/v/d or pain Ext:  No inc edema from baseline  Objective:  Blood pressure 131/86, pulse 66, height 5\' 2"  (1.575 m), weight 136 lb (61.7 kg), last menstrual period 04/10/2017, SpO2 98 %. Body mass index is 24.87 kg/m. Gen:   Well NAD, A and O *3 HEENT:     [place]/AT, EOMI,  MMM Lungs:   Normal work of breathing. CTA B/L, no Wh, rhonchi Heart:   RRR, S1, S2 WNL's, no MRG Abd:   No gross distention Exts:    warm, pink,  Brisk capillary refill, warm and well perfused.  Psych:    No HI/SI, judgement and insight good, Euthymic mood. Full Affect.   Recent Results (from  the past 2160 hour(s))  T4, free     Status: None   Collection Time: 04/28/17  9:46 AM  Result Value Ref Range   Free T4 1.22 0.82 - 1.77 ng/dL  TSH     Status: None   Collection Time: 04/28/17  9:46 AM  Result Value Ref Range   TSH 1.110 0.450 - 4.500 uIU/mL  T3, free     Status: None   Collection Time: 04/28/17  9:46 AM  Result Value Ref Range   T3, Free 2.9 2.0 - 4.4 pg/mL  VITAMIN D 25 Hydroxy (Vit-D Deficiency, Fractures)     Status: Abnormal   Collection Time: 04/28/17  9:46 AM  Result Value Ref Range   Vit D, 25-Hydroxy 24.0 (L) 30.0 - 100.0 ng/mL    Comment: Vitamin D deficiency has been defined by the Institute of Medicine and an Endocrine Society practice guideline as a level of serum 25-OH vitamin D less than 20 ng/mL (1,2). The Endocrine Society went on to further define vitamin D insufficiency as a level between 21 and 29 ng/mL (2). 1. IOM (Institute of Medicine). 2010. Dietary reference    intakes for calcium and D. [place]: The    [employer]. 2. Holick MF, Binkley [place], Bischoff-Ferrari HA, et al.    Evaluation, treatment, and prevention of vitamin D    deficiency: an Endocrine Society clinical practice    guideline. JCEM. 2011 Jul; 96(7):1911-30.   Lipid panel     Status: Abnormal   Collection Time: 04/28/17  9:46 AM  Result Value Ref Range   Cholesterol, Total 181 100 - 199 mg/dL   Triglycerides 43 0 - 149 mg/dL   HDL 70 >16 mg/dL   VLDL Cholesterol Cal 9 5 - 40 mg/dL   LDL Calculated 109 (H) 0 - 99 mg/dL   Chol/HDL Ratio 2.6 0.0 - 4.4 ratio    Comment:                                   T. Chol/HDL Ratio                                              Men  Women                               1/2 Avg.Risk  3.4    3.3                                   Avg.Risk  5.0    4.4                                2X Avg.Risk  9.6    7.1                                3X Avg.Risk 23.4   11.0   Hemoglobin A1c     Status: None   Collection Time: 04/28/17  9:46 AM  Result Value Ref Range   Hgb A1c MFr Bld 5.2 4.8 - 5.6 %  Comment:          Prediabetes: 5.7 - 6.4          Diabetes: >6.4          Glycemic control for adults with diabetes: <7.0    Est. average glucose Bld gHb Est-mCnc 103 mg/dL  Comprehensive metabolic panel     Status: None   Collection Time: 04/28/17  9:46 AM  Result Value Ref Range   Glucose 90 65 - 99 mg/dL   BUN 14 6 - 24 mg/dL   Creatinine, Ser 1.61 0.57 - 1.00 mg/dL   GFR calc non Af Amer 102 >59 mL/min/1.73   GFR calc Af Amer 117 >59 mL/min/1.73   BUN/Creatinine Ratio 21 9 - 23   Sodium 142 134 - 144 mmol/L   Potassium 4.7 3.5 - 5.2 mmol/L   Chloride 104 96 - 106 mmol/L   CO2 24 20 - 29 mmol/L   Calcium 9.0 8.7 - 10.2 mg/dL   Total Protein 6.9 6.0 - 8.5 g/dL   Albumin 4.5 3.5 - 5.5 g/dL   Globulin, Total 2.4 1.5 - 4.5 g/dL   Albumin/Globulin Ratio 1.9 1.2 - 2.2   Bilirubin Total 0.8 0.0 - 1.2 mg/dL   Alkaline Phosphatase 54 39 - 117 IU/L   AST 17 0 - 40 IU/L   ALT 9 0 - 32 IU/L  CBC with Differential/Platelet     Status: Abnormal   Collection Time: 04/28/17  9:46 AM  Result Value Ref Range   WBC 5.5 3.4 - 10.8 x10E3/uL   RBC 4.44 3.77 - 5.28 x10E6/uL   Hemoglobin 14.4 11.1 - 15.9 g/dL   Hematocrit 09.6 04.5 - 46.6 %   MCV 91 79 - 97 fL   MCH 32.4 26.6 - 33.0 pg   MCHC 35.8 (H) 31.5 - 35.7 g/dL   RDW 40.9 81.1 - 91.4 %   Platelets 207 150 - 379 x10E3/uL   Neutrophils 50 Not Estab. %   Lymphs 38 Not Estab. %   Monocytes 11 Not Estab. %   Eos 1 Not Estab. %   Basos 0 Not Estab. %   Neutrophils Absolute 2.8 1.4 - 7.0 x10E3/uL   Lymphocytes Absolute 2.1 0.7 - 3.1 x10E3/uL   Monocytes  Absolute 0.6 0.1 - 0.9 x10E3/uL   EOS (ABSOLUTE) 0.0 0.0 - 0.4 x10E3/uL   Basophils Absolute 0.0 0.0 - 0.2 x10E3/uL   Immature Granulocytes 0 Not Estab. %   Immature Grans (Abs) 0.0 0.0 - 0.1 x10E3/uL

## 2017-05-03 ENCOUNTER — Ambulatory Visit: Payer: 59 | Admitting: Family Medicine

## 2017-06-30 ENCOUNTER — Encounter: Payer: Self-pay | Admitting: Family Medicine

## 2017-12-27 ENCOUNTER — Other Ambulatory Visit: Payer: Self-pay | Admitting: Family Medicine

## 2017-12-27 ENCOUNTER — Ambulatory Visit
Admission: RE | Admit: 2017-12-27 | Discharge: 2017-12-27 | Disposition: A | Discharge status: Home / Self Care | Payer: Managed Care, Other (non HMO) | Source: Ambulatory Visit

## 2017-12-27 DIAGNOSIS — Z1231 Encounter for screening mammogram for malignant neoplasm of breast: Secondary | ICD-10-CM

## 2017-12-29 ENCOUNTER — Other Ambulatory Visit: Payer: Self-pay | Admitting: Family Medicine

## 2017-12-29 DIAGNOSIS — R928 Other abnormal and inconclusive findings on diagnostic imaging of breast: Secondary | ICD-10-CM

## 2018-01-03 ENCOUNTER — Ambulatory Visit
Admission: RE | Admit: 2018-01-03 | Discharge: 2018-01-03 | Disposition: A | Discharge status: Home / Self Care | Payer: Managed Care, Other (non HMO) | Source: Ambulatory Visit | Attending: Family Medicine | Admitting: Family Medicine

## 2018-01-03 DIAGNOSIS — R928 Other abnormal and inconclusive findings on diagnostic imaging of breast: Secondary | ICD-10-CM

## 2018-08-20 ENCOUNTER — Telehealth: Payer: Self-pay

## 2018-08-20 ENCOUNTER — Telehealth: Payer: Self-pay | Admitting: Family Medicine

## 2018-08-20 DIAGNOSIS — Z20822 Contact with and (suspected) exposure to covid-19: Secondary | ICD-10-CM

## 2018-08-20 DIAGNOSIS — J441 Chronic obstructive pulmonary disease with (acute) exacerbation: Secondary | ICD-10-CM

## 2018-08-20 NOTE — Telephone Encounter (Signed)
Spoke to patient - please see phone note in chart. MPulliam, CMA/RT(R)

## 2018-08-20 NOTE — Telephone Encounter (Signed)
Patient called states she has had headache for several days along with other minor symptoms &  Would like to be tested for COVID 19--- forwarding request to medical assistant.   --Dion Body

## 2018-08-20 NOTE — Telephone Encounter (Signed)
Scheduled patient for COVID 19 test tomorrow at 11:45 at [facility].  Testing protocol reviewed.

## 2018-08-20 NOTE — Telephone Encounter (Signed)
Patient called and states that she has been having "massive" headaches off and on x 1-2 weeks.  Runny nose, sneezing, fatigue.  Patient is requesting COVID testing.  Patient denies shortness of breath. MPulliam, CMA/RT(R)

## 2018-08-21 ENCOUNTER — Other Ambulatory Visit: Payer: Managed Care, Other (non HMO)

## 2018-08-21 ENCOUNTER — Telehealth: Payer: Self-pay | Admitting: Family Medicine

## 2018-08-21 NOTE — Telephone Encounter (Signed)
Patient called states she found a COVID 19 testing site w/ a short results time  & patient wants to cancel--glh

## 2018-11-08 ENCOUNTER — Other Ambulatory Visit: Payer: Self-pay | Admitting: Family Medicine

## 2018-11-08 DIAGNOSIS — Z1231 Encounter for screening mammogram for malignant neoplasm of breast: Secondary | ICD-10-CM

## 2018-11-19 ENCOUNTER — Other Ambulatory Visit: Payer: Self-pay

## 2018-11-19 ENCOUNTER — Encounter: Payer: Self-pay | Admitting: Family Medicine

## 2018-11-19 ENCOUNTER — Ambulatory Visit (INDEPENDENT_AMBULATORY_CARE_PROVIDER_SITE_OTHER): Payer: Managed Care, Other (non HMO) | Admitting: Family Medicine

## 2018-11-19 VITALS — BP 121/84 | HR 87 | Ht 62.0 in | Wt 129.0 lb

## 2018-11-19 DIAGNOSIS — Z23 Encounter for immunization: Secondary | ICD-10-CM | POA: Diagnosis not present

## 2018-11-19 DIAGNOSIS — M545 Low back pain, unspecified: Secondary | ICD-10-CM

## 2018-11-19 DIAGNOSIS — R198 Other specified symptoms and signs involving the digestive system and abdomen: Secondary | ICD-10-CM | POA: Diagnosis not present

## 2018-11-19 DIAGNOSIS — R1031 Right lower quadrant pain: Secondary | ICD-10-CM | POA: Insufficient documentation

## 2018-11-19 DIAGNOSIS — M62830 Muscle spasm of back: Secondary | ICD-10-CM | POA: Diagnosis not present

## 2018-11-19 LAB — POCT URINALYSIS DIPSTICK
Bilirubin, UA: NEGATIVE
Glucose, UA: NEGATIVE
Ketones, UA: NEGATIVE
Leukocytes, UA: NEGATIVE
Nitrite, UA: NEGATIVE
Protein, UA: NEGATIVE
Spec Grav, UA: >=1.03 — AB (ref 1.010–1.025)
Urobilinogen, UA: 2 E.U./dL — AB
pH, UA: 6 (ref 5.0–8.0)

## 2018-11-19 NOTE — Progress Notes (Signed)
Impression and Recommendations:    1. Right lower quadrant abdominal pain   2. Pulsatile abdomen; no masses appreciated   3. Acute right-sided low back pain without sciatica   4. Muscle spasm of back   5. Need for vaccination     - Patient agrees to follow up for yearly physical in near future; come fasting just in case.  - Will hold off on labs since abdominal exam normal and patient returning to clinic in very near future for CPE with full set of fasting blood work.   RLQ Abdominal Pain, Pulsatile Abdomen Centrally, Back Pain - Extensively discussed patient's symptoms in office today. - Lengthy discussion held possible etiologies-from back pain radiating to the front, constipation and gaseous pains, nephrolithiasis, UTI, ovarian cyst and others.  Education provided and all questions answered. - Urinalysis obtained today-sent for culture as it was relatively benign.  - Discussed options for imaging today. -Patient agreed she would like to hold off with CT abdomen pelvis and proceed with abdominal ultrasound discussed and ordered today.  As first-line work-up due to decreased radiation exposure, cost etc. - Reviewed that this is a good first-line starting point for imaging. - If needed, further imaging will be obtained.   - Reviewed that patient does have muscle spasms present in the back. - Advised walking, using heating pad for muscle spasms only.  She declined muscle relaxers today. - Encouraged patient to ask her husband to gently massage the area, from neck down to back to break up fascial plane adhesions etc.  - In future, advised that patient might need to follow up with OBGYN if indicated.  - Patient knows to watch for worsening of symptoms or red flags as advised. - Patient will let clinic and OBGYN know about symptoms as advised.  - Will continue to monitor.   Orders Placed This Encounter  Procedures  . CULTURE, URINE COMPREHENSIVE  . US Abdomen Complete  .  Flu Vaccine QUAD 36+ mos IM  . Tdap vaccine greater than or equal to 7yo IM  . POCT urinalysis dipstick    Gross side effects, risk and benefits, and alternatives of medications and treatment plan in general discussed with patient.  Patient is aware that all medications have potential side effects and we are unable to predict every side effect or drug-drug interaction that may occur.   Patient will call with any questions prior to using medication if they have concerns.    Expresses verbal understanding and consents to current therapy and treatment regimen.  No barriers to understanding were identified.  Red flag symptoms and signs discussed in detail.  Patient expressed understanding regarding what to do in case of emergency\urgent symptoms  Please see AVS handed out to patient at the end of our visit for further patient instructions/ counseling done pertaining to today's office visit.   Return for yearly physical in near future; come fasting just in case.     Note:  This note was prepared with assistance of Dragon voice recognition software. Occasional wrong-word or sound-a-like substitutions may have occurred due to the inherent limitations of voice recognition software.   This document serves as a record of services personally performed by Mellody Dance, DO. It was created on her behalf by Toni Amend, a trained medical scribe. The creation of this record is based on the scribe's personal observations and the provider's statements to them.   I have reviewed the above medical documentation for accuracy and completeness and  I concur.  Thomasene Lot, DO 11/19/2018 5:52 PM        --------------------------------------------------------------------------------------------------------------------------------------------------------------------------------------------------------------------------------------------    Subjective:     HPI: Brenda Ashley is a 53 y.o.  female who presents to [facility] today for issues as discussed below.  Pain in RLQ when she bends over; notes also "right on my back and I'm wondering if it's scar tissue from a mole I had removed."  Notes these are two separate pains but "maybe related."  States that neither pain are "really bad."  Notes one used to be worse and now doesn't feel as bad.  Feels like something is "catching or poking.".  - Abdominal Pain Abdominal pain started "I don't know, a couple weeks?"  Says it could have lasted longer, but definitely two weeks.  Says she noticed just "weird" when she bent over, "like a catch, but it's never completely gone."  Notes when she moves the wrong way, it feels like "someone's poking me."  Worse with:  Bending over.  Says "not stabbing, just annoying; feels like there's something right there."  Better with:  Not bending over.  Rates the pain at a one or a two.  Says there are times she's at a zero and doesn't notice it.  No changes with food.  Says it just happens when she bends over, "something just is off and weird."  Confirms that this pain occurs daily.  Has never had this pain before.  Has an IUD in place; sees OBGYN in [place], Dr. Ivan Croft for the past 21 years.  Denies nausea, vomiting, diarrhea.  Denies urinary sx of increased frequency, urgency, malodorous urine, or change in stream.  Denies changes in stool.  - Back Pain Feels the right back pain might have been happening a little longer; notes "feels like the lumbar support is on but it's not."    Wt Readings from Last 3 Encounters:  11/19/18 129 lb (58.5 kg)  05/01/17 136 lb (61.7 kg)  04/26/17 134 lb 14.4 oz (61.2 kg)   BP Readings from Last 3 Encounters:  11/19/18 121/84  05/01/17 131/86  04/26/17 134/84   Pulse Readings from Last 3 Encounters:  11/19/18 87  05/01/17 66  04/26/17 76   BMI Readings from Last 3 Encounters:  11/19/18 23.59 kg/m   05/01/17 24.87 kg/m  04/26/17 24.67 kg/m     Patient Care Team    Relationship Specialty Notifications Start End  Thomasene Lot, DO PCP - General Family Medicine  04/26/17   Bufford Buttner, MD Referring Physician Dermatology  04/26/17   Bobette Mo, MD  Obstetrics and Gynecology  04/26/17      Patient Active Problem List   Diagnosis Date Noted  . Muscle spasm of back 11/19/2018  . Acute right-sided low back pain without sciatica 11/19/2018  . Pulsatile abdomen; no masses appreciated 11/19/2018  . Right lower quadrant abdominal pain 11/19/2018  . Vitamin D deficiency 05/01/2017  . History of melanoma-sees Derm yearly 04/26/2017  . History of smoking 10 pack years 04/26/2017  . Family history of lung cancer-in non-smoker 04/26/2017  . FHx: early coronary artery disease 04/26/2017  . Family history of suicide- FATHER 04/26/2017  . Family history of diabetes mellitus (DM)-brother, 04/26/2017  . Family history of HIV infection- BROTHER 04/26/2017  . Family history of combined hyperlipidemia 04/26/2017  . Family history of hypertension 04/26/2017  . IUD (intrauterine device) in place-Per GYN 04/26/2017  . Inactivity 04/26/2017  . Family history  of alcoholism 04/26/2017    Past Medical history, Surgical history, Family history, Social history, Allergies and Medications have been entered into the medical record, reviewed and changed as needed.    Current Meds  Medication Sig  . Cholecalciferol (VITAMIN D3) 5000 units TABS 5,000 IU OTC vitamin D3 daily.    Allergies:  No Known Allergies   Review of Systems:  A fourteen system review of systems was performed and found to be positive as per HPI.   Objective:   Blood pressure 121/84, pulse 87, height 5\' 2"  (1.575 m), weight 129 lb (58.5 kg), SpO2 98 %. Body mass index is 23.59 kg/m. General:  Well Developed, well nourished, appropriate for stated age.  Neuro:  Alert and oriented,  extra-ocular muscles  intact  HEENT:  Normocephalic, atraumatic, neck supple, no carotid bruits appreciated  Skin:  no gross rash, warm, pink. Cardiac:  RRR, S1 S2 Respiratory:  ECTA B/L and A/P, Not using accessory muscles, speaking in full sentences- unlabored. Vascular:  Ext warm, no cyanosis apprec.; cap RF less 2 sec. Psych:  No HI/SI, judgement and insight good, Euthymic mood. Full Affect. Abdominal:  Completely normal aside from upon observation pulsatile central abdomen.  No guarding rigidity or rebound, bowel sounds x4 quadrants, no palpable masses or suprapubic tenderness Back: Patient with paravertebral muscle spasms on the right from the T4 all the way down to L2-L3, which was rope-like tightness of the muscle and tender to palpation at multiple points/multiple trigger points.  No costophrenic angle tenderness, negative straight leg raise; 5 out of 5 strength bilateral lower extremities with equal reflexes and neurovascularly intact distally

## 2018-11-21 LAB — CULTURE, URINE COMPREHENSIVE

## 2018-12-11 ENCOUNTER — Ambulatory Visit
Admission: RE | Admit: 2018-12-11 | Discharge: 2018-12-11 | Disposition: A | Discharge status: Home / Self Care | Payer: Managed Care, Other (non HMO) | Source: Ambulatory Visit | Attending: Family Medicine | Admitting: Family Medicine

## 2018-12-11 DIAGNOSIS — R198 Other specified symptoms and signs involving the digestive system and abdomen: Secondary | ICD-10-CM

## 2018-12-11 DIAGNOSIS — R1031 Right lower quadrant pain: Secondary | ICD-10-CM

## 2018-12-13 ENCOUNTER — Other Ambulatory Visit: Payer: Self-pay | Admitting: Family Medicine

## 2018-12-13 DIAGNOSIS — Z1231 Encounter for screening mammogram for malignant neoplasm of breast: Secondary | ICD-10-CM

## 2018-12-26 ENCOUNTER — Other Ambulatory Visit: Payer: Self-pay

## 2018-12-26 ENCOUNTER — Encounter: Payer: Managed Care, Other (non HMO) | Admitting: Family Medicine

## 2018-12-26 ENCOUNTER — Other Ambulatory Visit: Payer: Managed Care, Other (non HMO)

## 2018-12-26 ENCOUNTER — Encounter: Payer: Self-pay | Admitting: Family Medicine

## 2018-12-26 DIAGNOSIS — E559 Vitamin D deficiency, unspecified: Secondary | ICD-10-CM

## 2018-12-26 DIAGNOSIS — Z0000 Encounter for general adult medical examination without abnormal findings: Secondary | ICD-10-CM

## 2018-12-27 LAB — LIPID PANEL
Chol/HDL Ratio: 2.6 ratio (ref 0.0–4.4)
Cholesterol, Total: 203 mg/dL — ABNORMAL HIGH (ref 100–199)
HDL: 79 mg/dL (ref >39)
LDL Chol Calc (NIH): 115 mg/dL — ABNORMAL HIGH (ref 0–99)
Triglycerides: 47 mg/dL (ref 0–149)
VLDL Cholesterol Cal: 9 mg/dL (ref 5–40)

## 2018-12-27 LAB — COMPREHENSIVE METABOLIC PANEL
ALT: 9 IU/L (ref 0–32)
AST: 14 IU/L (ref 0–40)
Albumin/Globulin Ratio: 2 (ref 1.2–2.2)
Albumin: 4.7 g/dL (ref 3.8–4.9)
Alkaline Phosphatase: 58 IU/L (ref 39–117)
BUN/Creatinine Ratio: 24 — ABNORMAL HIGH (ref 9–23)
BUN: 18 mg/dL (ref 6–24)
Bilirubin Total: 0.7 mg/dL (ref 0.0–1.2)
CO2: 23 mmol/L (ref 20–29)
Calcium: 8.9 mg/dL (ref 8.7–10.2)
Chloride: 105 mmol/L (ref 96–106)
Creatinine, Ser: 0.75 mg/dL (ref 0.57–1.00)
GFR calc Af Amer: 106 mL/min/{1.73_m2} (ref >59)
GFR calc non Af Amer: 92 mL/min/{1.73_m2} (ref >59)
Globulin, Total: 2.4 g/dL (ref 1.5–4.5)
Glucose: 92 mg/dL (ref 65–99)
Potassium: 4.3 mmol/L (ref 3.5–5.2)
Sodium: 141 mmol/L (ref 134–144)
Total Protein: 7.1 g/dL (ref 6.0–8.5)

## 2018-12-27 LAB — CBC WITH DIFFERENTIAL/PLATELET
Basophils Absolute: 0 10*3/uL (ref 0.0–0.2)
Basos: 1 %
EOS (ABSOLUTE): 0.1 10*3/uL (ref 0.0–0.4)
Eos: 2 %
Hematocrit: 43.2 % (ref 34.0–46.6)
Hemoglobin: 14.7 g/dL (ref 11.1–15.9)
Immature Grans (Abs): 0 10*3/uL (ref 0.0–0.1)
Immature Granulocytes: 0 %
Lymphocytes Absolute: 1.8 10*3/uL (ref 0.7–3.1)
Lymphs: 43 %
MCH: 32.7 pg (ref 26.6–33.0)
MCHC: 34 g/dL (ref 31.5–35.7)
MCV: 96 fL (ref 79–97)
Monocytes Absolute: 0.4 10*3/uL (ref 0.1–0.9)
Monocytes: 10 %
Neutrophils Absolute: 1.8 10*3/uL (ref 1.4–7.0)
Neutrophils: 44 %
Platelets: 243 10*3/uL (ref 150–450)
RBC: 4.5 x10E6/uL (ref 3.77–5.28)
RDW: 11.8 % (ref 11.7–15.4)
WBC: 4.1 10*3/uL (ref 3.4–10.8)

## 2018-12-27 LAB — HEMOGLOBIN A1C
Est. average glucose Bld gHb Est-mCnc: 103 mg/dL
Hgb A1c MFr Bld: 5.2 % (ref 4.8–5.6)

## 2018-12-27 LAB — VITAMIN D 25 HYDROXY (VIT D DEFICIENCY, FRACTURES): Vit D, 25-Hydroxy: 30.7 ng/mL (ref 30.0–100.0)

## 2018-12-27 LAB — TSH: TSH: 1.34 u[IU]/mL (ref 0.450–4.500)

## 2018-12-27 LAB — T4, FREE: Free T4: 1.32 ng/dL (ref 0.82–1.77)

## 2018-12-27 LAB — T3: T3, Total: 111 ng/dL (ref 71–180)

## 2019-01-07 ENCOUNTER — Encounter: Payer: Self-pay | Admitting: Family Medicine

## 2019-01-07 ENCOUNTER — Ambulatory Visit (INDEPENDENT_AMBULATORY_CARE_PROVIDER_SITE_OTHER): Payer: Managed Care, Other (non HMO) | Admitting: Family Medicine

## 2019-01-07 ENCOUNTER — Other Ambulatory Visit: Payer: Self-pay

## 2019-01-07 VITALS — BP 143/85 | HR 77 | Temp 97.4°F | Resp 10 | Ht 62.0 in | Wt 128.3 lb

## 2019-01-07 DIAGNOSIS — Z23 Encounter for immunization: Secondary | ICD-10-CM

## 2019-01-07 DIAGNOSIS — Z1211 Encounter for screening for malignant neoplasm of colon: Secondary | ICD-10-CM

## 2019-01-07 DIAGNOSIS — Z0000 Encounter for general adult medical examination without abnormal findings: Secondary | ICD-10-CM | POA: Diagnosis not present

## 2019-01-07 DIAGNOSIS — Z719 Counseling, unspecified: Secondary | ICD-10-CM

## 2019-01-07 NOTE — Patient Instructions (Addendum)
Please check your blood pressure 3 times per week or more. Your goal blood pressure is 135/85 or less on a regular basis.   If home blood pressure is not running at goal, please follow up within a month or two.    Preventive Care for Adults, Female  A healthy lifestyle and preventive care can promote health and wellness. Preventive health guidelines for women include the following key practices.   A routine yearly physical is a good way to check with your health care provider about your health and preventive screening. It is a chance to share any concerns and updates on your health and to receive a thorough exam.   Visit your dentist for a routine exam and preventive care every 6 months. Brush your teeth twice a day and floss once a day. Good oral hygiene prevents tooth decay and gum disease.   The frequency of eye exams is based on your age, health, family medical history, use of contact lenses, and other factors. Follow your health care provider's recommendations for frequency of eye exams.   Eat a healthy diet. Foods like vegetables, fruits, whole grains, low-fat dairy products, and lean protein foods contain the nutrients you need without too many calories. Decrease your intake of foods high in solid fats, added sugars, and salt. Eat the right amount of calories for you.Get information about a proper diet from your health care provider, if necessary.   Regular physical exercise is one of the most important things you can do for your health. Most adults should get at least 150 minutes of moderate-intensity exercise (any activity that increases your heart rate and causes you to sweat) each week. In addition, most adults need muscle-strengthening exercises on 2 or more days a week.   Maintain a healthy weight. The body mass index (BMI) is a screening tool to identify possible weight problems. It provides an estimate of body fat based on height and weight. Your health care provider can  find your BMI, and can help you achieve or maintain a healthy weight.For adults 20 years and older:   - A BMI below 18.5 is considered underweight.   - A BMI of 18.5 to 24.9 is normal.   - A BMI of 25 to 29.9 is considered overweight.   - A BMI of 30 and above is considered obese.   Maintain normal blood lipids and cholesterol levels by exercising and minimizing your intake of trans and saturated fats.  Eat a balanced diet with plenty of fruit and vegetables. Blood tests for lipids and cholesterol should begin at age 17 and be repeated every 5 years minimum.  If your lipid or cholesterol levels are high, you are over 40, or you are at high risk for heart disease, you may need your cholesterol levels checked more frequently.Ongoing high lipid and cholesterol levels should be treated with medicines if diet and exercise are not working.   If you smoke, find out from your health care provider how to quit. If you do not use tobacco, do not start.   Lung cancer screening is recommended for adults aged 84-80 years who are at high risk for developing lung cancer because of a history of smoking. A yearly low-dose CT scan of the lungs is recommended for people who have at least a 30-pack-year history of smoking and are a current smoker or have quit within the past 15 years. A pack year of smoking is smoking an average of 1 pack of cigarettes a day  for 1 year (for example: 1 pack a day for 30 years or 2 packs a day for 15 years). Yearly screening should continue until the smoker has stopped smoking for at least 15 years. Yearly screening should be stopped for people who develop a health problem that would prevent them from having lung cancer treatment.   If you are pregnant, do not drink alcohol. If you are breastfeeding, be very cautious about drinking alcohol. If you are not pregnant and choose to drink alcohol, do not have more than 1 drink per day. One drink is considered to be 12 ounces (355 mL) of beer,  5 ounces (148 mL) of wine, or 1.5 ounces (44 mL) of liquor.   Avoid use of street drugs. Do not share needles with anyone. Ask for help if you need support or instructions about stopping the use of drugs.   High blood pressure causes heart disease and increases the risk of stroke. Your blood pressure should be checked at least yearly.  Ongoing high blood pressure should be treated with medicines if weight loss and exercise do not work.   If you are 59-63 years old, ask your health care provider if you should take aspirin to prevent strokes.   Diabetes screening involves taking a blood sample to check your fasting blood sugar level. This should be done once every 3 years, after age 58, if you are within normal weight and without risk factors for diabetes. Testing should be considered at a younger age or be carried out more frequently if you are overweight and have at least 1 risk factor for diabetes.   Breast cancer screening is essential preventive care for women. You should practice "breast self-awareness."  This means understanding the normal appearance and feel of your breasts and may include breast self-examination.  Any changes detected, no matter how small, should be reported to a health care provider.  Women in their 42s and 30s should have a clinical breast exam (CBE) by a health care provider as part of a regular health exam every 1 to 3 years.  After age 63, women should have a CBE every year.  Starting at age 59, women should consider having a mammogram (breast X-ray test) every year.  Women who have a family history of breast cancer should talk to their health care provider about genetic screening.  Women at a high risk of breast cancer should talk to their health care providers about having an MRI and a mammogram every year.   -Breast cancer gene (BRCA)-related cancer risk assessment is recommended for women who have family members with BRCA-related cancers. BRCA-related cancers include  breast, ovarian, tubal, and peritoneal cancers. Having family members with these cancers may be associated with an increased risk for harmful changes (mutations) in the breast cancer genes BRCA1 and BRCA2. Results of the assessment will determine the need for genetic counseling and BRCA1 and BRCA2 testing.   The Pap test is a screening test for cervical cancer. A Pap test can show cell changes on the cervix that might become cervical cancer if left untreated. A Pap test is a procedure in which cells are obtained and examined from the lower end of the uterus (cervix).   - Women should have a Pap test starting at age 55.   - Between ages 50 and 84, Pap tests should be repeated every 2 years.   - Beginning at age 42, you should have a Pap test every 3 years as long as the  past 3 Pap tests have been normal.   - Some women have medical problems that increase the chance of getting cervical cancer. Talk to your health care provider about these problems. It is especially important to talk to your health care provider if a new problem develops soon after your last Pap test. In these cases, your health care provider may recommend more frequent screening and Pap tests.   - The above recommendations are the same for women who have or have not gotten the vaccine for human papillomavirus (HPV).   - If you had a hysterectomy for a problem that was not cancer or a condition that could lead to cancer, then you no longer need Pap tests. Even if you no longer need a Pap test, a regular exam is a good idea to make sure no other problems are starting.   - If you are between ages 39 and 69 years, and you have had normal Pap tests going back 10 years, you no longer need Pap tests. Even if you no longer need a Pap test, a regular exam is a good idea to make sure no other problems are starting.   - If you have had past treatment for cervical cancer or a condition that could lead to cancer, you need Pap tests and screening  for cancer for at least 20 years after your treatment.   - If Pap tests have been discontinued, risk factors (such as a new sexual partner) need to be reassessed to determine if screening should be resumed.   - The HPV test is an additional test that may be used for cervical cancer screening. The HPV test looks for the virus that can cause the cell changes on the cervix. The cells collected during the Pap test can be tested for HPV. The HPV test could be used to screen women aged 12 years and older, and should be used in women of any age who have unclear Pap test results. After the age of 55, women should have HPV testing at the same frequency as a Pap test.   Colorectal cancer can be detected and often prevented. Most routine colorectal cancer screening begins at the age of 57 years and continues through age 35 years. However, your health care provider may recommend screening at an earlier age if you have risk factors for colon cancer. On a yearly basis, your health care provider may provide home test kits to check for hidden blood in the stool.  Use of a small camera at the end of a tube, to directly examine the colon (sigmoidoscopy or colonoscopy), can detect the earliest forms of colorectal cancer. Talk to your health care provider about this at age 57, when routine screening begins. Direct exam of the colon should be repeated every 5 -10 years through age 35 years, unless early forms of pre-cancerous polyps or small growths are found.   People who are at an increased risk for hepatitis B should be screened for this virus. You are considered at high risk for hepatitis B if:  -You were born in a country where hepatitis B occurs often. Talk with your health care provider about which countries are considered high risk.  - Your parents were born in a high-risk country and you have not received a shot to protect against hepatitis B (hepatitis B vaccine).  - You have HIV or AIDS.  - You use needles to  inject street drugs.  - You live with, or have sex with,  someone who has Hepatitis B.  - You get hemodialysis treatment.  - You take certain medicines for conditions like cancer, organ transplantation, and autoimmune conditions.   Hepatitis C blood testing is recommended for all people born from [birth year] through 1965 and any individual with known risks for hepatitis C.   Practice safe sex. Use condoms and avoid high-risk sexual practices to reduce the spread of sexually transmitted infections (STIs). STIs include gonorrhea, chlamydia, syphilis, trichomonas, herpes, HPV, and human immunodeficiency virus (HIV). Herpes, HIV, and HPV are viral illnesses that have no cure. They can result in disability, cancer, and death. Sexually active women aged 2 years and younger should be checked for chlamydia. Older women with new or multiple partners should also be tested for chlamydia. Testing for other STIs is recommended if you are sexually active and at increased risk.   Osteoporosis is a disease in which the bones lose minerals and strength with aging. This can result in serious bone fractures or breaks. The risk of osteoporosis can be identified using a bone density scan. Women ages 59 years and over and women at risk for fractures or osteoporosis should discuss screening with their health care providers. Ask your health care provider whether you should take a calcium supplement or vitamin D to There are also several preventive steps women can take to avoid osteoporosis and resulting fractures or to keep osteoporosis from worsening. -->Recommendations include:  Eat a balanced diet high in fruits, vegetables, calcium, and vitamins.  Get enough calcium. The recommended total intake of is 1,200 mg daily; for best absorption, if taking supplements, divide doses into 250-500 mg doses throughout the day. Of the two types of calcium, calcium carbonate is best absorbed when taken with food but calcium citrate can be  taken on an empty stomach.  Get enough vitamin D. [employer] and the [employer] recommend at least 1,000 IU per day for women age 16 and over who are at risk of vitamin D deficiency. Vitamin D deficiency can be caused by inadequate sun exposure (for example, those who live in [place]).  Avoid alcohol and smoking. Heavy alcohol intake (more than 7 drinks per week) increases the risk of falls and hip fracture and women smokers tend to lose bone more rapidly and have lower bone mass than nonsmokers. Stopping smoking is one of the most important changes women can make to improve their health and decrease risk for disease.  Be physically active every day. Weight-bearing exercise (for example, fast walking, hiking, jogging, and weight training) may strengthen bones or slow the rate of bone loss that comes with aging. Balancing and muscle-strengthening exercises can reduce the risk of falling and fracture.  Consider therapeutic medications. Currently, several types of effective drugs are available. Healthcare providers can recommend the type most appropriate for each woman.  Eliminate environmental factors that may contribute to accidents. Falls cause nearly 90% of all osteoporotic fractures, so reducing this risk is an important bone-health strategy. Measures include ample lighting, removing obstructions to walking, using nonskid rugs on floors, and placing mats and/or grab bars in showers.  Be aware of medication side effects. Some common medicines make bones weaker. These include a type of steroid drug called glucocorticoids used for arthritis and asthma, some antiseizure drugs, certain sleeping pills, treatments for endometriosis, and some cancer drugs. An overactive thyroid gland or using too much thyroid hormone for an underactive thyroid can also be a problem. If you are taking these medicines, talk to your doctor  about what you can do to help protect your bones.reduce the rate  of osteoporosis.    Menopause can be associated with physical symptoms and risks. Hormone replacement therapy is available to decrease symptoms and risks. You should talk to your health care provider about whether hormone replacement therapy is right for you.   Use sunscreen. Apply sunscreen liberally and repeatedly throughout the day. You should seek shade when your shadow is shorter than you. Protect yourself by wearing long sleeves, pants, a wide-brimmed hat, and sunglasses year round, whenever you are outdoors.   Once a month, do a whole body skin exam, using a mirror to look at the skin on your back. Tell your health care provider of new moles, moles that have irregular borders, moles that are larger than a pencil eraser, or moles that have changed in shape or color.   -Stay current with required vaccines (immunizations).   Influenza vaccine. All adults should be immunized every year.  Tetanus, diphtheria, and acellular pertussis (Td, Tdap) vaccine. Pregnant women should receive 1 dose of Tdap vaccine during each pregnancy. The dose should be obtained regardless of the length of time since the last dose. Immunization is preferred during the 27th 36th week of gestation. An adult who has not previously received Tdap or who does not know her vaccine status should receive 1 dose of Tdap. This initial dose should be followed by tetanus and diphtheria toxoids (Td) booster doses every 10 years. Adults with an unknown or incomplete history of completing a 3-dose immunization series with Td-containing vaccines should begin or complete a primary immunization series including a Tdap dose. Adults should receive a Td booster every 10 years.  Varicella vaccine. An adult without evidence of immunity to varicella should receive 2 doses or a second dose if she has previously received 1 dose. Pregnant females who do not have evidence of immunity should receive the first dose after pregnancy. This first dose  should be obtained before leaving the health care facility. The second dose should be obtained 4 8 weeks after the first dose.  Human papillomavirus (HPV) vaccine. Females aged 67 26 years who have not received the vaccine previously should obtain the 3-dose series. The vaccine is not recommended for use in pregnant females. However, pregnancy testing is not needed before receiving a dose. If a female is found to be pregnant after receiving a dose, no treatment is needed. In that case, the remaining doses should be delayed until after the pregnancy. Immunization is recommended for any person with an immunocompromised condition through the age of 65 years if she did not get any or all doses earlier. During the 3-dose series, the second dose should be obtained 4 8 weeks after the first dose. The third dose should be obtained 24 weeks after the first dose and 16 weeks after the second dose.  Zoster vaccine. One dose is recommended for adults aged 35 years or older unless certain conditions are present.  Measles, mumps, and rubella (MMR) vaccine. Adults born before 75 generally are considered immune to measles and mumps. Adults born in [birth year] or later should have 1 or more doses of MMR vaccine unless there is a contraindication to the vaccine or there is laboratory evidence of immunity to each of the three diseases. A routine second dose of MMR vaccine should be obtained at least 28 days after the first dose for students attending postsecondary schools, health care workers, or international travelers. People who received inactivated measles vaccine or an  unknown type of measles vaccine during 1963 1967 should receive 2 doses of MMR vaccine. People who received inactivated mumps vaccine or an unknown type of mumps vaccine before 1979 and are at high risk for mumps infection should consider immunization with 2 doses of MMR vaccine. For females of childbearing age, rubella immunity should be determined. If there is  no evidence of immunity, females who are not pregnant should be vaccinated. If there is no evidence of immunity, females who are pregnant should delay immunization until after pregnancy. Unvaccinated health care workers born before [birth year] who lack laboratory evidence of measles, mumps, or rubella immunity or laboratory confirmation of disease should consider measles and mumps immunization with 2 doses of MMR vaccine or rubella immunization with 1 dose of MMR vaccine.  Pneumococcal 13-valent conjugate (PCV13) vaccine. When indicated, a person who is uncertain of her immunization history and has no record of immunization should receive the PCV13 vaccine. An adult aged 30 years or older who has certain medical conditions and has not been previously immunized should receive 1 dose of PCV13 vaccine. This PCV13 should be followed with a dose of pneumococcal polysaccharide (PPSV23) vaccine. The PPSV23 vaccine dose should be obtained at least 8 weeks after the dose of PCV13 vaccine. An adult aged 68 years or older who has certain medical conditions and previously received 1 or more doses of PPSV23 vaccine should receive 1 dose of PCV13. The PCV13 vaccine dose should be obtained 1 or more years after the last PPSV23 vaccine dose.  Pneumococcal polysaccharide (PPSV23) vaccine. When PCV13 is also indicated, PCV13 should be obtained first. All adults aged 42 years and older should be immunized. An adult younger than age 75 years who has certain medical conditions should be immunized. Any person who resides in a nursing home or long-term care facility should be immunized. An adult smoker should be immunized. People with an immunocompromised condition and certain other conditions should receive both PCV13 and PPSV23 vaccines. People with human immunodeficiency virus (HIV) infection should be immunized as soon as possible after diagnosis. Immunization during chemotherapy or radiation therapy should be avoided. Routine use of  PPSV23 vaccine is not recommended for American Indians, [place] Natives, or people younger than 65 years unless there are medical conditions that require PPSV23 vaccine. When indicated, people who have unknown immunization and have no record of immunization should receive PPSV23 vaccine. One-time revaccination 5 years after the first dose of PPSV23 is recommended for people aged 69 64 years who have chronic kidney failure, nephrotic syndrome, asplenia, or immunocompromised conditions. People who received 1 2 doses of PPSV23 before age 43 years should receive another dose of PPSV23 vaccine at age 66 years or later if at least 5 years have passed since the previous dose. Doses of PPSV23 are not needed for people immunized with PPSV23 at or after age 8 years.  Meningococcal vaccine. Adults with asplenia or persistent complement component deficiencies should receive 2 doses of quadrivalent meningococcal conjugate (MenACWY-D) vaccine. The doses should be obtained at least 2 months apart. Microbiologists working with certain meningococcal bacteria, [employer] recruits, people at risk during an outbreak, and people who travel to or live in countries with a high rate of meningitis should be immunized. A first-year college student up through age 70 years who is living in a residence hall should receive a dose if she did not receive a dose on or after her 16th birthday. Adults who have certain high-risk conditions should receive one or more doses of  vaccine.  Hepatitis A vaccine. Adults who wish to be protected from this disease, have certain high-risk conditions, work with hepatitis A-infected animals, work in hepatitis A research labs, or travel to or work in countries with a high rate of hepatitis A should be immunized. Adults who were previously unvaccinated and who anticipate close contact with an international adoptee during the first 60 days after arrival in the [place] from a country with a high rate of  hepatitis A should be immunized.  Hepatitis B vaccine.  Adults who wish to be protected from this disease, have certain high-risk conditions, may be exposed to blood or other infectious body fluids, are household contacts or sex partners of hepatitis B positive people, are clients or workers in certain care facilities, or travel to or work in countries with a high rate of hepatitis B should be immunized.  Haemophilus influenzae type b (Hib) vaccine. A previously unvaccinated person with asplenia or sickle cell disease or having a scheduled splenectomy should receive 1 dose of Hib vaccine. Regardless of previous immunization, a recipient of a hematopoietic stem cell transplant should receive a 3-dose series 6 12 months after her successful transplant. Hib vaccine is not recommended for adults with HIV infection.  Preventive Services / Frequency Ages 57 to 39years  Blood pressure check.** / Every 1 to 2 years.  Lipid and cholesterol check.** / Every 5 years beginning at age 52.  Clinical breast exam.** / Every 3 years for women in their 28s and 110s.  BRCA-related cancer risk assessment.** / For women who have family members with a BRCA-related cancer (breast, ovarian, tubal, or peritoneal cancers).  Pap test.** / Every 2 years from ages 74 through 37. Every 3 years starting at age 33 through age 61 or 63 with a history of 3 consecutive normal Pap tests.  HPV screening.** / Every 3 years from ages 28 through ages 49 to 54 with a history of 3 consecutive normal Pap tests.  Hepatitis C blood test.** / For any individual with known risks for hepatitis C.  Skin self-exam. / Monthly.  Influenza vaccine. / Every year.  Tetanus, diphtheria, and acellular pertussis (Tdap, Td) vaccine.** / Consult your health care provider. Pregnant women should receive 1 dose of Tdap vaccine during each pregnancy. 1 dose of Td every 10 years.  Varicella vaccine.** / Consult your health care provider. Pregnant  females who do not have evidence of immunity should receive the first dose after pregnancy.  HPV vaccine. / 3 doses over 6 months, if 41 and younger. The vaccine is not recommended for use in pregnant females. However, pregnancy testing is not needed before receiving a dose.  Measles, mumps, rubella (MMR) vaccine.** / You need at least 1 dose of MMR if you were born in 1957 or later. You may also need a 2nd dose. For females of childbearing age, rubella immunity should be determined. If there is no evidence of immunity, females who are not pregnant should be vaccinated. If there is no evidence of immunity, females who are pregnant should delay immunization until after pregnancy.  Pneumococcal 13-valent conjugate (PCV13) vaccine.** / Consult your health care provider.  Pneumococcal polysaccharide (PPSV23) vaccine.** / 1 to 2 doses if you smoke cigarettes or if you have certain conditions.  Meningococcal vaccine.** / 1 dose if you are age 71 to 39 years and a Market researcher living in a residence hall, or have one of several medical conditions, you need to get vaccinated against meningococcal disease.  You may also need additional booster doses.  Hepatitis A vaccine.** / Consult your health care provider.  Hepatitis B vaccine.** / Consult your health care provider.  Haemophilus influenzae type b (Hib) vaccine.** / Consult your health care provider.  Ages 53 to 64years  Blood pressure check.** / Every 1 to 2 years.  Lipid and cholesterol check.** / Every 5 years beginning at age 36 years.  Lung cancer screening. / Every year if you are aged 11 80 years and have a 30-pack-year history of smoking and currently smoke or have quit within the past 15 years. Yearly screening is stopped once you have quit smoking for at least 15 years or develop a health problem that would prevent you from having lung cancer treatment.  Clinical breast exam.** / Every year after age 8  years.  BRCA-related cancer risk assessment.** / For women who have family members with a BRCA-related cancer (breast, ovarian, tubal, or peritoneal cancers).  Mammogram.** / Every year beginning at age 78 years and continuing for as long as you are in good health. Consult with your health care provider.  Pap test.** / Every 3 years starting at age 44 years through age 46 or 68 years with a history of 3 consecutive normal Pap tests.  HPV screening.** / Every 3 years from ages 39 years through ages 36 to 64 years with a history of 3 consecutive normal Pap tests.  Fecal occult blood test (FOBT) of stool. / Every year beginning at age 46 years and continuing until age 33 years. You may not need to do this test if you get a colonoscopy every 10 years.  Flexible sigmoidoscopy or colonoscopy.** / Every 5 years for a flexible sigmoidoscopy or every 10 years for a colonoscopy beginning at age 76 years and continuing until age 73 years.  Hepatitis C blood test.** / For all people born from [birth year] through 1964 and any individual with known risks for hepatitis C.  Skin self-exam. / Monthly.  Influenza vaccine. / Every year.  Tetanus, diphtheria, and acellular pertussis (Tdap/Td) vaccine.** / Consult your health care provider. Pregnant women should receive 1 dose of Tdap vaccine during each pregnancy. 1 dose of Td every 10 years.  Varicella vaccine.** / Consult your health care provider. Pregnant females who do not have evidence of immunity should receive the first dose after pregnancy.  Zoster vaccine.** / 1 dose for adults aged 46 years or older.  Measles, mumps, rubella (MMR) vaccine.** / You need at least 1 dose of MMR if you were born in 1957 or later. You may also need a 2nd dose. For females of childbearing age, rubella immunity should be determined. If there is no evidence of immunity, females who are not pregnant should be vaccinated. If there is no evidence of immunity, females who are  pregnant should delay immunization until after pregnancy.  Pneumococcal 13-valent conjugate (PCV13) vaccine.** / Consult your health care provider.  Pneumococcal polysaccharide (PPSV23) vaccine.** / 1 to 2 doses if you smoke cigarettes or if you have certain conditions.  Meningococcal vaccine.** / Consult your health care provider.  Hepatitis A vaccine.** / Consult your health care provider.  Hepatitis B vaccine.** / Consult your health care provider.  Haemophilus influenzae type b (Hib) vaccine.** / Consult your health care provider.  Ages 53 years and over  Blood pressure check.** / Every 1 to 2 years.  Lipid and cholesterol check.** / Every 5 years beginning at age 45 years.  Lung cancer screening. / Every  year if you are aged 53 80 years and have a 30-pack-year history of smoking and currently smoke or have quit within the past 15 years. Yearly screening is stopped once you have quit smoking for at least 15 years or develop a health problem that would prevent you from having lung cancer treatment.  Clinical breast exam.** / Every year after age 56 years.  BRCA-related cancer risk assessment.** / For women who have family members with a BRCA-related cancer (breast, ovarian, tubal, or peritoneal cancers).  Mammogram.** / Every year beginning at age 5 years and continuing for as long as you are in good health. Consult with your health care provider.  Pap test.** / Every 3 years starting at age 60 years through age 89 or 63 years with 3 consecutive normal Pap tests. Testing can be stopped between 65 and 70 years with 3 consecutive normal Pap tests and no abnormal Pap or HPV tests in the past 10 years.  HPV screening.** / Every 3 years from ages 13 years through ages 77 or 60 years with a history of 3 consecutive normal Pap tests. Testing can be stopped between 65 and 70 years with 3 consecutive normal Pap tests and no abnormal Pap or HPV tests in the past 10 years.  Fecal occult  blood test (FOBT) of stool. / Every year beginning at age 87 years and continuing until age 58 years. You may not need to do this test if you get a colonoscopy every 10 years.  Flexible sigmoidoscopy or colonoscopy.** / Every 5 years for a flexible sigmoidoscopy or every 10 years for a colonoscopy beginning at age 47 years and continuing until age 80 years.  Hepatitis C blood test.** / For all people born from [birth year] through 1964 and any individual with known risks for hepatitis C.  Osteoporosis screening.** / A one-time screening for women ages 67 years and over and women at risk for fractures or osteoporosis.  Skin self-exam. / Monthly.  Influenza vaccine. / Every year.  Tetanus, diphtheria, and acellular pertussis (Tdap/Td) vaccine.** / 1 dose of Td every 10 years.  Varicella vaccine.** / Consult your health care provider.  Zoster vaccine.** / 1 dose for adults aged 53 years or older.  Pneumococcal 13-valent conjugate (PCV13) vaccine.** / Consult your health care provider.  Pneumococcal polysaccharide (PPSV23) vaccine.** / 1 dose for all adults aged 33 years and older.  Meningococcal vaccine.** / Consult your health care provider.  Hepatitis A vaccine.** / Consult your health care provider.  Hepatitis B vaccine.** / Consult your health care provider.  Haemophilus influenzae type b (Hib) vaccine.** / Consult your health care provider. ** Family history and personal history of risk and conditions may change your health care provider's recommendations. Document Released: 03/29/2001 Document Revised: 11/21/2012  [facility] Patient Information 2014 [place], [place].   EXERCISE AND DIET:  We recommended that you start or continue a regular exercise program for good health. Regular exercise means any activity that makes your heart beat faster and makes you sweat.  We recommend exercising at least 30 minutes per day at least 3 days a week, preferably 5.  We also recommend a diet low in fat  and sugar / carbohydrates.  Inactivity, poor dietary choices and obesity can cause diabetes, heart attack, stroke, and kidney damage, among others.     ALCOHOL AND SMOKING:  Women should limit their alcohol intake to no more than 7 drinks/beers/glasses of wine (combined, not each!) per week. Moderation of alcohol intake to this  level decreases your risk of breast cancer and liver damage.  ( And of course, no recreational drugs are part of a healthy lifestyle.)  Also, you should not be smoking at all or even being exposed to second hand smoke. Most people know smoking can cause cancer, and various heart and lung diseases, but did you know it also contributes to weakening of your bones?  Aging of your skin?  Yellowing of your teeth and nails?   CALCIUM AND VITAMIN D:  Adequate intake of calcium and Vitamin D are recommended.  The recommendations for exact amounts of these supplements seem to change often, but generally speaking 600 mg of calcium (either carbonate or citrate) and 800 units of Vitamin D per day seems prudent. Certain women may benefit from higher intake of Vitamin D.  If you are among these women, your doctor will have told you during your visit.     PAP SMEARS:  Pap smears, to check for cervical cancer or precancers,  have traditionally been done yearly, although recent scientific advances have shown that most women can have pap smears less often.  However, every woman still should have a physical exam from her gynecologist or primary care physician every year. It will include a breast check, inspection of the vulva and vagina to check for abnormal growths or skin changes, a visual exam of the cervix, and then an exam to evaluate the size and shape of the uterus and ovaries.  And after 52 years of age, a rectal exam is indicated to check for rectal cancers. We will also provide age appropriate advice regarding health maintenance, like when you should have certain vaccines, screening for  sexually transmitted diseases, bone density testing, colonoscopy, mammograms, etc.    MAMMOGRAMS:  All women over 67 years old should have a yearly mammogram. Many facilities now offer a "3D" mammogram, which may cost around $50 extra out of pocket. If possible,  we recommend you accept the option to have the 3D mammogram performed.  It both reduces the number of women who will be called back for extra views which then turn out to be normal, and it is better than the routine mammogram at detecting truly abnormal areas.     COLONOSCOPY:  Colonoscopy to screen for colon cancer is recommended for all women at age 89.  We know, you hate the idea of the prep.  We agree, BUT, having colon cancer and not knowing it is worse!!  Colon cancer so often starts as a polyp that can be seen and removed at colonscopy, which can quite literally save your life!  And if your first colonoscopy is normal and you have no family history of colon cancer, most women don't have to have it again for 10 years.  Once every ten years, you can do something that may end up saving your life, right?  We will be happy to help you get it scheduled when you are ready.  Be sure to check your insurance coverage so you understand how much it will cost.  It may be covered as a preventative service at no cost, but you should check your particular policy.

## 2019-01-07 NOTE — Progress Notes (Signed)
Impression and Recommendations:    1. Encounter for wellness examination   2. Health education/counseling   3. Need for shingles vaccine   4. Screening for colon cancer      1) Anticipatory Guidance: Discussed importance of wearing a seatbelt while driving, not texting while driving; sunscreen when outside along with yearly skin surveillance; eating a well balanced and modest diet; physical activity at least 25 minutes per day or 150 min/ week of moderate to intense activity.  - Reviewed goal BP with patient today, 135/85 or less.  - Advised patient to clean ears regularly with mixture of half rubbing alcohol, half hydrogen peroxide.  - Patient knows to continue to follow up with all specialists as established.  2) Immunizations / Screenings / Labs:    All immunizations and screenings that patient agrees to, are up-to-date per recommendations or will be updated today.  Patient understands the needs for q 5mo dental and yearly vision screens which pt will schedule independently. Obtain CBC, CMP, HgA1c, Lipid panel, TSH and vit D when fasting if not already done recently.   - Due for mammogram; per patient scheduled next month. - Advised patient to call her insurance to discuss her need for specialized breast screenings and coverage given her fibrocystic breast disease.  - Need for colonoscopy; see orders.  Per patient, calling to get in for the first of the year.  - TDAP up to date.  - Need for shingles vaccination.  Education provided today.  - Discussed need for Hep C/HIV screening with patient today.  Per patient, obtained when son was born.  3) Lifestyle & Preventative Health Maintenance: Discussed goal of improving nutrient density of diet through increasing intake of fruits and vegetables and decreasing saturated/trans fats, white flour products and refined sugar products.   - Advised patient to continue working toward exercising to improve overall mental, physical,  and emotional health.    - Reviewed the "spokes of the wheel" of mood and health management.  Stressed the importance of ongoing prudent habits, including regular exercise, appropriate sleep hygiene, healthful dietary habits, and prayer/meditation to relax.  - Encouraged patient to engage in daily physical activity as tolerated, especially a formal exercise routine.  Recommended that the patient eventually strive for at least 150 minutes of moderate cardiovascular activity per week according to guidelines established by the [facility].   - Healthy dietary habits encouraged, including low-carb, and high amounts of lean protein in diet.   - Patient should also consume adequate amounts of water.  - Health counseling performed.  All questions answered.   Orders Placed This Encounter  Procedures  . Varicella-zoster vaccine IM (Shingrix)  . Ambulatory referral to Gastroenterology    Return for f/up in 6-12 months unless BP is poorly controlled, or other concerns.   Gross side effects, risk and benefits, and alternatives of medications discussed with patient.  Patient is aware that all medications have potential side effects and we are unable to predict every side effect or drug-drug interaction that may occur.  Expresses verbal understanding and consents to current therapy plan and treatment regimen.  F-up preventative CPE in 1 year- reminded pt again, this is in addition to any chronic care visits.    Please see orders placed and AVS handed out to patient at the end of our visit for further patient instructions/ counseling done pertaining to today's office visit.  This document serves as a record of services personally performed by Mellody Dance, DO. It  was created on her behalf by Peggye FothergillKatherine Galloway, a trained medical scribe. The creation of this record is based on the scribe's personal observations and the provider's statements to them.   This case required medical decision making of at least  moderate complexity. The above documentation has been reviewed to be accurate and was completed by Carlye Grippeeborah J. Rachyl Wuebker, D.O.      Subjective:    I, Peggye FothergillKatherine Galloway, am serving as scribe for Dr. Thomasene Loteborah Johari Bennetts.  Chief Complaint  Patient presents with  . Annual Exam    HPI: Brenda Ashley is a 53 y.o. female who [facility] a yearly health maintenance exam.  Health Maintenance Summary Reviewed and updated, unless pt declines services.  Colonoscopy:  Need for colonoscopy. Tobacco History Reviewed:  Y; former smoker; 0.5 ppd, 10 pack-years.  Alcohol:    No concerns, no excessive use Exercise Habits:   Not meeting AHA guidelines; says she hasn't been exercising and has been lazy.  Notes she's trying to make "little goals" for herself to be healthier. STD concerns:   None reported. Drug Use:   None reported. Birth control method:  [facility]. Menses regular:   Managed by OBGYN. Lumps or breast concerns:      no Breast Cancer Family History:      No  Notes trying to stay calm during COVID.  States "life isn't that bad, and some people have it far worse."  - Female Health Follows up with OBGYN regularly.  Notes given her history, she is going every six months and obtaining yearly pap smears.  She has had cervical / cone biopsy in the past.  Notes she loves her OBGYN.  - Visual Health Obtains eye exams with ophthalmologist yearly.  - Dermatological Health Follows up with dermatology once yearly.  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  - General Health Denies diarrhea, constipation, or other GI concerns.  Denies urinary concerns.    Immunization History  Administered Date(s) Administered  . Influenza,inj,Quad PF,6+ Mos 11/19/2018  . Tdap 11/19/2018  . Zoster Recombinat (Shingrix) 01/07/2019    Health Maintenance  Topic Date Due  . HIV Screening  04/07/1981  . PAP SMEAR-Modifier  12/09/2019  . MAMMOGRAM   12/28/2019  . COLONOSCOPY  10/25/2026  . TETANUS/TDAP  11/18/2028  . INFLUENZA VACCINE  Completed     Wt Readings from Last 3 Encounters:  01/07/19 128 lb 4.8 oz (58.2 kg)  11/19/18 129 lb (58.5 kg)  05/01/17 136 lb (61.7 kg)   BP Readings from Last 3 Encounters:  01/07/19 (!) 143/85  11/19/18 121/84  05/01/17 131/86   Pulse Readings from Last 3 Encounters:  01/07/19 77  11/19/18 87  05/01/17 66     History reviewed. No pertinent past medical history.    Past Surgical History:  Procedure Laterality Date  . WRIST FRACTURE SURGERY  2010      Family History  Problem Relation Age of Onset  . Breast cancer Maternal Aunt   . Breast cancer Cousin   . Lung cancer Mother   . Heart attack Father   . Depression Father   . Hyperlipidemia Father   . Diabetes Brother   . Hyperlipidemia Brother       Social History   Substance and Sexual Activity  Drug Use No  ,   Social History   Substance and Sexual Activity  Alcohol Use Yes   Comment: 1/ month  ,  Social History   Tobacco Use  Smoking Status Former Smoker  . Packs/day: 0.50  . Years: 15.00  . Pack years: 7.50  . Types: Cigarettes  . Quit date: 42  . Years since quitting: 27.9  Smokeless Tobacco Never Used  ,   Social History   Substance and Sexual Activity  Sexual Activity Yes  . Partners: Male  . Birth control/protection: I.U.D.    Current Outpatient Medications on File Prior to Visit  Medication Sig Dispense Refill  . Cholecalciferol (VITAMIN D3) 5000 units TABS 5,000 IU OTC vitamin D3 daily. 90 tablet 3   No current facility-administered medications on file prior to visit.     Allergies: Codeine  Review of Systems: General:   Denies fever, chills, unexplained weight loss.  Optho/Auditory:   Denies visual changes, blurred vision/LOV Respiratory:   Denies SOB, DOE more than baseline levels.   Cardiovascular:   Denies chest pain, palpitations, new onset peripheral edema   Gastrointestinal:   Denies nausea, vomiting, diarrhea.  Genitourinary: Denies dysuria, freq/ urgency, flank pain or discharge from genitals.  Endocrine:     Denies hot or cold intolerance, polyuria, polydipsia. Musculoskeletal:   Denies unexplained myalgias, joint swelling, unexplained arthralgias, gait problems.  Skin:  Denies rash, suspicious lesions Neurological:     Denies dizziness, unexplained weakness, numbness  Psychiatric/Behavioral:   Denies mood changes, suicidal or homicidal ideations, hallucinations    Objective:    Blood pressure (!) 143/85, pulse 77, temperature (!) 97.4 F (36.3 C), temperature source Oral, resp. rate 10, height 5\' 2"  (1.575 m), weight 128 lb 4.8 oz (58.2 kg), SpO2 100 %. Body mass index is 23.47 kg/m. General Appearance:    Alert, cooperative, no distress, appears stated age  Head:    Normocephalic, without obvious abnormality, atraumatic  Eyes:    PERRL, conjunctiva/corneas clear, EOM's intact, fundi    benign, both eyes  Ears:    Normal TM's and external ear canals, both ears  Nose:   Nares normal, septum midline, mucosa normal, no drainage    or sinus tenderness  Throat:   Lips w/o lesion, mucosa moist, and tongue normal; teeth and   gums normal  Neck:   Supple, symmetrical, trachea midline, no adenopathy;    thyroid:  no enlargement/tenderness/nodules; no carotid   bruit or JVD  Back:     Symmetric, no curvature, ROM normal, no CVA tenderness  Lungs:     Clear to auscultation bilaterally, respirations unlabored, no       Wh/ R/ R  Chest Wall:    No tenderness or gross deformity; normal excursion   Heart:    Regular rate and rhythm, S1 and S2 normal, no murmur, rub   or gallop  Breast Exam:    Deferred to OBGYN.  Abdomen:     Soft, non-tender, bowel sounds active all four quadrants, NO   G/R/R, no masses, no organomegaly  Genitalia:    Deferred to OBGYN.  Rectal:    Deferred to OBGYN.  Extremities:   Extremities normal, atraumatic, no cyanosis  or gross edema  Pulses:   2+ and symmetric all extremities  Skin:   Warm, dry, Skin color, texture, turgor normal, no obvious rashes or lesions Psych: No HI/SI, judgement and insight good, Euthymic mood. Full Affect.  Neurologic:   CNII-XII intact, normal strength, sensation and reflexes    Throughout

## 2019-01-22 ENCOUNTER — Encounter: Payer: Managed Care, Other (non HMO) | Admitting: Family Medicine

## 2019-01-31 ENCOUNTER — Other Ambulatory Visit: Payer: Self-pay

## 2019-01-31 ENCOUNTER — Ambulatory Visit
Admission: RE | Admit: 2019-01-31 | Discharge: 2019-01-31 | Disposition: A | Discharge status: Home / Self Care | Payer: Managed Care, Other (non HMO) | Source: Ambulatory Visit | Attending: Family Medicine | Admitting: Family Medicine

## 2019-01-31 DIAGNOSIS — Z1231 Encounter for screening mammogram for malignant neoplasm of breast: Secondary | ICD-10-CM

## 2019-02-04 ENCOUNTER — Other Ambulatory Visit: Payer: Self-pay | Admitting: Family Medicine

## 2019-02-04 DIAGNOSIS — R928 Other abnormal and inconclusive findings on diagnostic imaging of breast: Secondary | ICD-10-CM

## 2019-02-13 ENCOUNTER — Ambulatory Visit
Admission: RE | Admit: 2019-02-13 | Discharge: 2019-02-13 | Disposition: A | Discharge status: Home / Self Care | Payer: Managed Care, Other (non HMO) | Source: Ambulatory Visit | Attending: Family Medicine | Admitting: Family Medicine

## 2019-02-13 ENCOUNTER — Other Ambulatory Visit: Payer: Self-pay | Admitting: Family Medicine

## 2019-02-13 ENCOUNTER — Other Ambulatory Visit: Payer: Self-pay

## 2019-02-13 DIAGNOSIS — R928 Other abnormal and inconclusive findings on diagnostic imaging of breast: Secondary | ICD-10-CM

## 2019-03-07 ENCOUNTER — Other Ambulatory Visit: Payer: Self-pay

## 2019-03-07 ENCOUNTER — Other Ambulatory Visit: Payer: Self-pay | Admitting: Family Medicine

## 2019-03-07 ENCOUNTER — Ambulatory Visit
Admission: RE | Admit: 2019-03-07 | Discharge: 2019-03-07 | Disposition: A | Discharge status: Home / Self Care | Payer: Managed Care, Other (non HMO) | Source: Ambulatory Visit | Attending: Family Medicine | Admitting: Family Medicine

## 2019-03-07 DIAGNOSIS — R928 Other abnormal and inconclusive findings on diagnostic imaging of breast: Secondary | ICD-10-CM

## 2019-03-08 ENCOUNTER — Other Ambulatory Visit: Payer: Self-pay | Admitting: Family Medicine

## 2019-03-08 DIAGNOSIS — N649 Disorder of breast, unspecified: Secondary | ICD-10-CM

## 2019-03-20 ENCOUNTER — Ambulatory Visit
Admission: RE | Admit: 2019-03-20 | Discharge: 2019-03-20 | Disposition: A | Discharge status: Home / Self Care | Payer: Managed Care, Other (non HMO) | Source: Ambulatory Visit | Attending: Family Medicine | Admitting: Family Medicine

## 2019-03-20 ENCOUNTER — Other Ambulatory Visit: Payer: Self-pay

## 2019-03-20 DIAGNOSIS — N649 Disorder of breast, unspecified: Secondary | ICD-10-CM

## 2019-03-29 ENCOUNTER — Ambulatory Visit: Payer: Self-pay | Admitting: Surgery

## 2019-03-29 NOTE — H&P (Signed)
Brenda Ashley Documented: 03/29/2019 9:32 AM Location: [facility] Patient #: [id number] DOB: 1965-12-02 Married / Language: Brenda Ashley / Race: White Female  History of Present Illness Brenda Moores A. Neyah Ellerman MD; 03/29/2019 10:04 AM) Patient words: Patient presents at the request of the [facility] /Brenda Ashley to evaluate right breast mammographic abnormalities. She underwent 2 core biopsies. One area in the upper quadrant was fibrocystic change of microcalcifications. A second breast lesion in the right was a radial scar. She does have a family history of breast cancer on her father's side of her cousin and aunt. This is her first breast biopsy. She is sore from her biopsy but had no significant complications.               ADDENDUM: Pathology revealed FIBROCYSTIC CHANGE WITH CALCIFICATIONS of the Right breast, far lateral and posterior. This was found to be concordant by Brenda Ashley.  Pathology results were discussed with the patient by telephone. The patient reported doing well after the biopsy with tenderness at the site. Post biopsy instructions and care were reviewed and questions were answered. The patient was encouraged to call [facility] for any additional concerns.  The patient has a biopsy proven COMPLEX SCLEROSING LESION, with excision recommended of the Right breast, 10 o'clock, and is scheduled to see Dr. Erroll Ashley at [facility] on March 29, 2019.  Pathology results reported by Brenda Purser, RN on 03/21/2019.          Diagnosis Breast, right, needle core biopsy, 10 o'clock - COMPLEX SCLEROSING LESION WITH FIBROCYSTIC CHANGE AND CALCIFICATIONS. - BENIGN SKELETAL MUSCLE.        Diagnosis Breast, right, needle core biopsy, far lateral and posterior - FIBROCYSTIC CHANGE WITH CALCIFICATIONS. - NO MALIGNANCY IDENTIFIED.  The patient is a 53 year old  female.   Past Surgical History Brenda Ashley, [place]; 03/29/2019 9:32 AM) Breast Biopsy Right. multiple  Diagnostic Studies History Brenda Ashley, [place]; 03/29/2019 9:32 AM) Colonoscopy never Mammogram within last year Pap Smear 1-5 years ago  Allergies Brenda Ashley, [place]; 03/29/2019 9:33 AM) Codeine Phosphate *ANALGESICS - OPIOID* Allergies Reconciled  Medication History Brenda Ashley, CMA; 03/29/2019 9:33 AM) No Current Medications Medications Reconciled  Social History Brenda Ashley, CMA; 03/29/2019 9:32 AM) Alcohol use Occasional alcohol use. Caffeine use Carbonated beverages, Coffee. No drug use Tobacco use Former smoker.  Family History Brenda Ashley, [place]; 03/29/2019 9:32 AM) Cancer Mother. Diabetes Mellitus Brother. Heart Disease Father. Heart disease in female family member before age 67 Respiratory Condition Mother.  Pregnancy / Birth History Brenda Ashley, [place]; 03/29/2019 9:32 AM) Age at menarche 59 years. Contraceptive History Intrauterine device. Gravida 2 Irregular periods Maternal age 23-35 Para 2  Other Problems Brenda Ashley, [place]; 03/29/2019 9:32 AM) Melanoma     Review of Systems Brenda Ashley CMA; 03/29/2019 9:32 AM) General Not Present- Appetite Loss, Chills, Fatigue, Fever, Night Sweats, Weight Gain and Weight Loss. Skin Not Present- Change in Wart/Mole, Dryness, Hives, Jaundice, New Lesions, Non-Healing Wounds, Rash and Ulcer. HEENT Present- Wears glasses/contact lenses. Not Present- Earache, Hearing Loss, Hoarseness, Nose Bleed, Oral Ulcers, Ringing in the Ears, Seasonal Allergies, Sinus Pain, Sore Throat, Visual Disturbances and Yellow Eyes. Respiratory Not Present- Bloody sputum, Chronic Cough, Difficulty Breathing, Snoring and Wheezing. Breast Present- Breast Mass. Not Present- Breast Pain, Nipple Discharge and Skin Changes. Cardiovascular Not Present- Chest Pain, Difficulty Breathing Lying Down, Leg  Cramps, Palpitations, Rapid Heart Rate, Shortness of Breath and Swelling of  Extremities. Gastrointestinal Not Present- Abdominal Pain, Bloating, Bloody Stool, Change in Bowel Habits, Chronic diarrhea, Constipation, Difficulty Swallowing, Excessive gas, Gets full quickly at meals, Hemorrhoids, Indigestion, Nausea, Rectal Pain and Vomiting. Female Genitourinary Not Present- Frequency, Nocturia, Painful Urination, Pelvic Pain and Urgency. Musculoskeletal Not Present- Back Pain, Joint Pain, Joint Stiffness, Muscle Pain, Muscle Weakness and Swelling of Extremities. Neurological Not Present- Decreased Memory, Fainting, Headaches, Numbness, Seizures, Tingling, Tremor, Trouble walking and Weakness. Psychiatric Not Present- Anxiety, Bipolar, Change in Sleep Pattern, Depression, Fearful and Frequent crying. Endocrine Not Present- Cold Intolerance, Excessive Hunger, Hair Changes, Heat Intolerance, Hot flashes and New Diabetes. Hematology Present- Easy Bruising. Not Present- Blood Thinners, Excessive bleeding, Gland problems, HIV and Persistent Infections.  Vitals Brenda Ashley CMA; 03/29/2019 9:33 AM) 03/29/2019 9:33 AM Weight: 132 lb Height: 62in Body Surface Area: 1.6 m Body Mass Index: 24.14 kg/m  Temp.: 98.41F  Pulse: 107 (Regular)  BP: 118/78 (Sitting, Left Arm, Standard)        Physical Exam (Brenda Sanon A. Zahari Fazzino MD; 03/29/2019 10:05 AM)  General Mental Status-Alert. General Appearance-Consistent with stated age. Hydration-Well hydrated. Voice-Normal.  Chest and Lung Exam Note: WOB normal no wheeze  Breast Breast - Left-Symmetric, Non Tender, No Biopsy scars, no Dimpling - Left, No Inflammation, No Lumpectomy scars, No Mastectomy scars, No Peau d' Orange. Breast - Right-Symmetric, Non Tender, No Biopsy scars, no Dimpling - Right, No Inflammation, No Lumpectomy scars, No Mastectomy scars, No Peau d' Orange. Breast Lump-No Palpable Breast Mass. Note: Minimal  right breast biopsy change  Cardiovascular Note: NSR no heave  Neurologic Neurologic evaluation reveals -alert and oriented x 3 with no impairment of recent or remote memory. Mental Status-Normal.  Neuropsychiatric Examination of related systems reveals-The patient is well-nourished and well-groomed and strength and tone is normal overall with no atrophy, spasticity or temors.  Lymphatic Head & Neck  General Head & Neck Lymphatics: Bilateral - Description - Normal. Axillary  General Axillary Region: Bilateral - Description - Normal. Tenderness - Non Tender.    Assessment & Plan (Brenda Grassel A. 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 Discussed observation as well with short-term follow-up. She is opted for lumpectomy. Risk of lumpectomy include bleeding, infection, seroma, more surgery, use of seed/wire, wound care, cosmetic deformity and the need for other treatments, death , blood clots, death. Pt agrees to proceed.  Current Plans You are being scheduled for surgery- Our schedulers will call you.  You should hear from our office's scheduling department within 5 working days about the location, date, and time of surgery. We try to make accommodations for patient's preferences in scheduling surgery, but sometimes the OR schedule or the surgeon's schedule prevents [place] from making those accommodations.  If you have not heard from our office [phone]) in 5 working days, call the office and ask for your surgeon's nurse.  If you have other questions about your diagnosis, plan, or surgery, call the office and ask for your surgeon's nurse.  Pt Education - [facility] Breast Biopsy HCI: discussed with patient and provided information.

## 2019-04-09 ENCOUNTER — Ambulatory Visit: Payer: Self-pay | Admitting: Surgery

## 2019-04-09 DIAGNOSIS — N6310 Unspecified lump in the right breast, unspecified quadrant: Secondary | ICD-10-CM

## 2019-04-10 ENCOUNTER — Other Ambulatory Visit: Payer: Self-pay | Admitting: Surgery

## 2019-04-10 ENCOUNTER — Encounter: Payer: Self-pay | Admitting: Family Medicine

## 2019-04-10 DIAGNOSIS — N6310 Unspecified lump in the right breast, unspecified quadrant: Secondary | ICD-10-CM

## 2019-04-10 NOTE — Progress Notes (Signed)
Patient was referred for colonoscopy.  Apparently patient declines this.  She would not return phone calls.  Please order Cologuard for her.  Thank you

## 2019-04-12 ENCOUNTER — Telehealth: Payer: Self-pay | Admitting: Family Medicine

## 2019-04-12 DIAGNOSIS — Z1211 Encounter for screening for malignant neoplasm of colon: Secondary | ICD-10-CM

## 2019-04-12 NOTE — Telephone Encounter (Signed)
Order has been placed. AS, CMA 

## 2019-04-12 NOTE — Telephone Encounter (Signed)
-----   Message from Thomasene Lot, DO sent at 04/10/2019  6:28 PM EST ----- Regarding: FW: Bulk patient communication   ----- Message ----- From: Louann Sjogren Sent: 04/10/2019  11:08 AM EST To: Thomasene Lot, DO Subject: Bulk patient communication

## 2019-04-20 ENCOUNTER — Encounter: Payer: Self-pay | Admitting: Family Medicine

## 2019-05-01 ENCOUNTER — Ambulatory Visit: Payer: Managed Care, Other (non HMO)

## 2019-05-10 LAB — COLOGUARD
COLOGUARD: NEGATIVE
Cologuard: NEGATIVE

## 2019-05-21 ENCOUNTER — Telehealth: Payer: Self-pay

## 2019-05-21 NOTE — Telephone Encounter (Signed)
Pt informed of results of Cologuard.  Pt expressed understanding.  T. Kindall Swaby, CMA  

## 2019-05-22 ENCOUNTER — Other Ambulatory Visit: Payer: Self-pay

## 2019-05-22 ENCOUNTER — Encounter (HOSPITAL_BASED_OUTPATIENT_CLINIC_OR_DEPARTMENT_OTHER): Payer: Self-pay | Admitting: Surgery

## 2019-05-27 ENCOUNTER — Encounter (HOSPITAL_BASED_OUTPATIENT_CLINIC_OR_DEPARTMENT_OTHER)
Admission: RE | Admit: 2019-05-27 | Discharge: 2019-05-27 | Disposition: A | Discharge status: Home / Self Care | Payer: Managed Care, Other (non HMO) | Source: Ambulatory Visit | Attending: Surgery | Admitting: Surgery

## 2019-05-27 ENCOUNTER — Other Ambulatory Visit (HOSPITAL_COMMUNITY)
Admission: RE | Admit: 2019-05-27 | Discharge: 2019-05-27 | Disposition: A | Discharge status: Home / Self Care | Payer: Managed Care, Other (non HMO) | Source: Ambulatory Visit | Attending: Surgery | Admitting: Surgery

## 2019-05-27 DIAGNOSIS — Z20822 Contact with and (suspected) exposure to covid-19: Secondary | ICD-10-CM | POA: Insufficient documentation

## 2019-05-27 DIAGNOSIS — Z886 Allergy status to analgesic agent status: Secondary | ICD-10-CM | POA: Diagnosis not present

## 2019-05-27 DIAGNOSIS — Z01812 Encounter for preprocedural laboratory examination: Secondary | ICD-10-CM | POA: Diagnosis present

## 2019-05-27 DIAGNOSIS — N6011 Diffuse cystic mastopathy of right breast: Secondary | ICD-10-CM | POA: Diagnosis not present

## 2019-05-27 DIAGNOSIS — Z87891 Personal history of nicotine dependence: Secondary | ICD-10-CM | POA: Diagnosis not present

## 2019-05-27 DIAGNOSIS — Z803 Family history of malignant neoplasm of breast: Secondary | ICD-10-CM | POA: Diagnosis not present

## 2019-05-27 DIAGNOSIS — Z885 Allergy status to narcotic agent status: Secondary | ICD-10-CM | POA: Diagnosis not present

## 2019-05-27 DIAGNOSIS — N6489 Other specified disorders of breast: Secondary | ICD-10-CM | POA: Diagnosis present

## 2019-05-27 LAB — CBC WITH DIFFERENTIAL/PLATELET
Abs Immature Granulocytes: 0.01 10*3/uL (ref 0.00–0.07)
Basophils Absolute: 0 10*3/uL (ref 0.0–0.1)
Basophils Relative: 1 %
Eosinophils Absolute: 0.2 10*3/uL (ref 0.0–0.5)
Eosinophils Relative: 3 %
HCT: 42.6 % (ref 36.0–46.0)
Hemoglobin: 14.3 g/dL (ref 12.0–15.0)
Immature Granulocytes: 0 %
Lymphocytes Relative: 25 %
Lymphs Abs: 1.3 10*3/uL (ref 0.7–4.0)
MCH: 32.2 pg (ref 26.0–34.0)
MCHC: 33.6 g/dL (ref 30.0–36.0)
MCV: 95.9 fL (ref 80.0–100.0)
Monocytes Absolute: 0.8 10*3/uL (ref 0.1–1.0)
Monocytes Relative: 15 %
Neutro Abs: 3.1 10*3/uL (ref 1.7–7.7)
Neutrophils Relative %: 56 %
Platelets: 167 10*3/uL (ref 150–400)
RBC: 4.44 MIL/uL (ref 3.87–5.11)
RDW: 11.8 % (ref 11.5–15.5)
WBC: 5.4 10*3/uL (ref 4.0–10.5)
nRBC: 0 % (ref 0.0–0.2)

## 2019-05-27 LAB — COMPREHENSIVE METABOLIC PANEL
ALT: 16 U/L (ref 0–44)
AST: 17 U/L (ref 15–41)
Albumin: 3.8 g/dL (ref 3.5–5.0)
Alkaline Phosphatase: 45 U/L (ref 38–126)
Anion gap: 8 (ref 5–15)
BUN: 15 mg/dL (ref 6–20)
CO2: 26 mmol/L (ref 22–32)
Calcium: 9 mg/dL (ref 8.9–10.3)
Chloride: 106 mmol/L (ref 98–111)
Creatinine, Ser: 0.65 mg/dL (ref 0.44–1.00)
GFR calc Af Amer: >60 mL/min (ref >60)
GFR calc non Af Amer: >60 mL/min (ref >60)
Glucose, Bld: 93 mg/dL (ref 70–99)
Potassium: 4.3 mmol/L (ref 3.5–5.1)
Sodium: 140 mmol/L (ref 135–145)
Total Bilirubin: 0.2 mg/dL — ABNORMAL LOW (ref 0.3–1.2)
Total Protein: 6.7 g/dL (ref 6.5–8.1)

## 2019-05-27 LAB — SARS CORONAVIRUS 2 (TAT 6-24 HRS): SARS Coronavirus 2: NEGATIVE

## 2019-05-27 NOTE — Progress Notes (Addendum)
Surgical soap given with instructions, pt verbalized understanding.  POCT day of surgery

## 2019-05-29 ENCOUNTER — Ambulatory Visit
Admission: RE | Admit: 2019-05-29 | Discharge: 2019-05-29 | Disposition: A | Discharge status: Home / Self Care | Payer: Managed Care, Other (non HMO) | Source: Ambulatory Visit | Attending: Surgery | Admitting: Surgery

## 2019-05-29 ENCOUNTER — Other Ambulatory Visit: Payer: Self-pay

## 2019-05-29 DIAGNOSIS — N6310 Unspecified lump in the right breast, unspecified quadrant: Secondary | ICD-10-CM

## 2019-05-30 ENCOUNTER — Other Ambulatory Visit: Payer: Self-pay

## 2019-05-30 ENCOUNTER — Ambulatory Visit (HOSPITAL_BASED_OUTPATIENT_CLINIC_OR_DEPARTMENT_OTHER): Payer: Managed Care, Other (non HMO) | Admitting: Certified Registered Nurse Anesthetist

## 2019-05-30 ENCOUNTER — Ambulatory Visit (HOSPITAL_BASED_OUTPATIENT_CLINIC_OR_DEPARTMENT_OTHER)
Admission: RE | Admit: 2019-05-30 | Discharge: 2019-05-30 | Disposition: A | Discharge status: Home / Self Care | Payer: Managed Care, Other (non HMO) | Attending: Surgery | Admitting: Surgery

## 2019-05-30 ENCOUNTER — Encounter (HOSPITAL_BASED_OUTPATIENT_CLINIC_OR_DEPARTMENT_OTHER): Payer: Self-pay | Admitting: Surgery

## 2019-05-30 ENCOUNTER — Ambulatory Visit
Admission: RE | Admit: 2019-05-30 | Discharge: 2019-05-30 | Disposition: A | Discharge status: Home / Self Care | Payer: Managed Care, Other (non HMO) | Source: Ambulatory Visit | Attending: Surgery | Admitting: Surgery

## 2019-05-30 ENCOUNTER — Encounter (HOSPITAL_BASED_OUTPATIENT_CLINIC_OR_DEPARTMENT_OTHER)
Admission: RE | Disposition: A | Discharge status: Home / Self Care | Payer: Self-pay | Source: Home / Self Care | Attending: Surgery

## 2019-05-30 DIAGNOSIS — N6310 Unspecified lump in the right breast, unspecified quadrant: Secondary | ICD-10-CM

## 2019-05-30 DIAGNOSIS — Z803 Family history of malignant neoplasm of breast: Secondary | ICD-10-CM | POA: Insufficient documentation

## 2019-05-30 DIAGNOSIS — N6489 Other specified disorders of breast: Secondary | ICD-10-CM | POA: Diagnosis not present

## 2019-05-30 DIAGNOSIS — Z886 Allergy status to analgesic agent status: Secondary | ICD-10-CM | POA: Insufficient documentation

## 2019-05-30 DIAGNOSIS — Z885 Allergy status to narcotic agent status: Secondary | ICD-10-CM | POA: Insufficient documentation

## 2019-05-30 DIAGNOSIS — N6011 Diffuse cystic mastopathy of right breast: Secondary | ICD-10-CM | POA: Insufficient documentation

## 2019-05-30 DIAGNOSIS — Z87891 Personal history of nicotine dependence: Secondary | ICD-10-CM | POA: Insufficient documentation

## 2019-05-30 HISTORY — PX: BREAST LUMPECTOMY WITH RADIOACTIVE SEED LOCALIZATION: SHX6424

## 2019-05-30 HISTORY — DX: Other specified postprocedural states: Z98.890

## 2019-05-30 HISTORY — DX: Other specified postprocedural states: R11.2

## 2019-05-30 HISTORY — DX: Other complications of anesthesia, initial encounter: T88.59XA

## 2019-05-30 LAB — POCT PREGNANCY, URINE: Preg Test, Ur: NEGATIVE

## 2019-05-30 SURGERY — BREAST LUMPECTOMY WITH RADIOACTIVE SEED LOCALIZATION
Anesthesia: General | Site: Breast | Laterality: Right

## 2019-05-30 MED ORDER — CEFAZOLIN SODIUM-DEXTROSE 2-4 GM/100ML-% IV SOLN
INTRAVENOUS | Status: AC
  Filled 2019-05-30: qty 100

## 2019-05-30 MED ORDER — MIDAZOLAM HCL 2 MG/2ML IJ SOLN
INTRAMUSCULAR | Status: AC
  Filled 2019-05-30: qty 2

## 2019-05-30 MED ORDER — HYDROCODONE-ACETAMINOPHEN 5-325 MG PO TABS: 1.0000 | ORAL_TABLET | Freq: Four times a day (QID) | ORAL | 0 refills | Status: DC | PRN

## 2019-05-30 MED ORDER — LIDOCAINE HCL (CARDIAC) PF 100 MG/5ML IV SOSY
PREFILLED_SYRINGE | INTRAVENOUS | Status: DC | PRN
  Administered 2019-05-30: 80 mg via INTRATRACHEAL

## 2019-05-30 MED ORDER — PROPOFOL 10 MG/ML IV BOLUS
INTRAVENOUS | Status: DC | PRN
  Administered 2019-05-30: 180 mg via INTRAVENOUS

## 2019-05-30 MED ORDER — 0.9 % SODIUM CHLORIDE (POUR BTL) OPTIME
TOPICAL | Status: DC | PRN
  Administered 2019-05-30: 500 mL

## 2019-05-30 MED ORDER — BUPIVACAINE HCL (PF) 0.25 % IJ SOLN
INTRAMUSCULAR | Status: DC | PRN
  Administered 2019-05-30: 18 mL

## 2019-05-30 MED ORDER — DEXAMETHASONE SODIUM PHOSPHATE 10 MG/ML IJ SOLN
INTRAMUSCULAR | Status: DC | PRN
  Administered 2019-05-30: 5 mg via INTRAVENOUS

## 2019-05-30 MED ORDER — BUPIVACAINE HCL (PF) 0.25 % IJ SOLN
INTRAMUSCULAR | Status: AC
  Filled 2019-05-30: qty 30

## 2019-05-30 MED ORDER — LACTATED RINGERS IV SOLN: INTRAVENOUS | Status: DC

## 2019-05-30 MED ORDER — ONDANSETRON HCL 4 MG/2ML IJ SOLN
INTRAMUSCULAR | Status: AC
  Filled 2019-05-30: qty 2

## 2019-05-30 MED ORDER — PHENYLEPHRINE 40 MCG/ML (10ML) SYRINGE FOR IV PUSH (FOR BLOOD PRESSURE SUPPORT)
PREFILLED_SYRINGE | INTRAVENOUS | Status: AC
  Filled 2019-05-30: qty 10

## 2019-05-30 MED ORDER — LIDOCAINE 2% (20 MG/ML) 5 ML SYRINGE
INTRAMUSCULAR | Status: AC
  Filled 2019-05-30: qty 5

## 2019-05-30 MED ORDER — FENTANYL CITRATE (PF) 100 MCG/2ML IJ SOLN
INTRAMUSCULAR | Status: AC
  Filled 2019-05-30: qty 2

## 2019-05-30 MED ORDER — PROPOFOL 10 MG/ML IV BOLUS
INTRAVENOUS | Status: AC
  Filled 2019-05-30: qty 40

## 2019-05-30 MED ORDER — CHLORHEXIDINE GLUCONATE CLOTH 2 % EX PADS: 6.0000 | MEDICATED_PAD | Freq: Once | CUTANEOUS | Status: DC

## 2019-05-30 MED ORDER — PHENYLEPHRINE HCL (PRESSORS) 10 MG/ML IV SOLN
INTRAVENOUS | Status: DC | PRN
  Administered 2019-05-30: 80 ug via INTRAVENOUS

## 2019-05-30 MED ORDER — DEXAMETHASONE SODIUM PHOSPHATE 10 MG/ML IJ SOLN
INTRAMUSCULAR | Status: AC
  Filled 2019-05-30: qty 1

## 2019-05-30 MED ORDER — CEFAZOLIN SODIUM-DEXTROSE 2-4 GM/100ML-% IV SOLN
2.0000 g | Freq: Once | INTRAVENOUS | Status: AC
  Administered 2019-05-30: 2 g via INTRAVENOUS

## 2019-05-30 MED ORDER — LACTATED RINGERS IV SOLN: INTRAVENOUS | Status: DC | PRN

## 2019-05-30 MED ORDER — MIDAZOLAM HCL 2 MG/2ML IJ SOLN
INTRAMUSCULAR | Status: DC | PRN
  Administered 2019-05-30: 2 mg via INTRAVENOUS

## 2019-05-30 MED ORDER — ONDANSETRON HCL 4 MG/2ML IJ SOLN
INTRAMUSCULAR | Status: DC | PRN
  Administered 2019-05-30: 4 mg via INTRAVENOUS

## 2019-05-30 MED ORDER — FENTANYL CITRATE (PF) 250 MCG/5ML IJ SOLN
INTRAMUSCULAR | Status: DC | PRN
  Administered 2019-05-30: 25 ug via INTRAVENOUS

## 2019-05-30 MED ORDER — IBUPROFEN 800 MG PO TABS: 800.0000 mg | ORAL_TABLET | Freq: Three times a day (TID) | ORAL | 0 refills | Status: DC | PRN

## 2019-05-30 MED ORDER — FENTANYL CITRATE (PF) 100 MCG/2ML IJ SOLN: 25.0000 ug | INTRAMUSCULAR | Status: DC | PRN

## 2019-05-30 SURGICAL SUPPLY — 51 items
ADH SKN CLS APL DERMABOND .7 (GAUZE/BANDAGES/DRESSINGS) ×1
APL PRP STRL LF DISP 70% ISPRP (MISCELLANEOUS) ×1
APPLIER CLIP 9.375 MED OPEN (MISCELLANEOUS)
APR CLP MED 9.3 20 MLT OPN (MISCELLANEOUS)
BINDER BREAST LRG (GAUZE/BANDAGES/DRESSINGS) IMPLANT
BINDER BREAST MEDIUM (GAUZE/BANDAGES/DRESSINGS) IMPLANT
BINDER BREAST XLRG (GAUZE/BANDAGES/DRESSINGS) IMPLANT
BINDER BREAST XXLRG (GAUZE/BANDAGES/DRESSINGS) IMPLANT
BLADE SURG 15 STRL LF DISP TIS (BLADE) ×1 IMPLANT
BLADE SURG 15 STRL SS (BLADE) ×3
CANISTER SUC SOCK COL 7IN (MISCELLANEOUS) IMPLANT
CANISTER SUCT 1200ML W/VALVE (MISCELLANEOUS) IMPLANT
CHLORAPREP W/TINT 26 (MISCELLANEOUS) ×3 IMPLANT
CLIP APPLIE 9.375 MED OPEN (MISCELLANEOUS) IMPLANT
COVER BACK TABLE 60X90IN (DRAPES) ×3 IMPLANT
COVER MAYO STAND STRL (DRAPES) ×3 IMPLANT
COVER PROBE W GEL 5X96 (DRAPES) ×3 IMPLANT
COVER WAND RF STERILE (DRAPES) IMPLANT
DECANTER SPIKE VIAL GLASS SM (MISCELLANEOUS) IMPLANT
DERMABOND ADVANCED (GAUZE/BANDAGES/DRESSINGS) ×2
DERMABOND ADVANCED .7 DNX12 (GAUZE/BANDAGES/DRESSINGS) ×1 IMPLANT
DRAPE LAPAROSCOPIC ABDOMINAL (DRAPES) IMPLANT
DRAPE LAPAROTOMY 100X72 PEDS (DRAPES) ×3 IMPLANT
DRAPE UTILITY XL STRL (DRAPES) ×3 IMPLANT
ELECT COATED BLADE 2.86 ST (ELECTRODE) ×3 IMPLANT
ELECT REM PT RETURN 9FT ADLT (ELECTROSURGICAL) ×3
ELECTRODE REM PT RTRN 9FT ADLT (ELECTROSURGICAL) ×1 IMPLANT
GLOVE BIOGEL PI IND STRL 8 (GLOVE) ×1 IMPLANT
GLOVE BIOGEL PI INDICATOR 8 (GLOVE) ×2
GLOVE ECLIPSE 8.0 STRL XLNG CF (GLOVE) ×3 IMPLANT
GOWN STRL REUS W/ TWL LRG LVL3 (GOWN DISPOSABLE) ×2 IMPLANT
GOWN STRL REUS W/TWL LRG LVL3 (GOWN DISPOSABLE) ×6
HEMOSTAT ARISTA ABSORB 3G PWDR (HEMOSTASIS) IMPLANT
HEMOSTAT SNOW SURGICEL 2X4 (HEMOSTASIS) IMPLANT
KIT MARKER MARGIN INK (KITS) ×3 IMPLANT
NDL HYPO 25X1 1.5 SAFETY (NEEDLE) ×1 IMPLANT
NEEDLE HYPO 25X1 1.5 SAFETY (NEEDLE) ×3 IMPLANT
NS IRRIG 1000ML POUR BTL (IV SOLUTION) ×3 IMPLANT
PACK BASIN DAY SURGERY FS (CUSTOM PROCEDURE TRAY) ×3 IMPLANT
PENCIL SMOKE EVACUATOR (MISCELLANEOUS) ×3 IMPLANT
SLEEVE SCD COMPRESS KNEE MED (MISCELLANEOUS) ×3 IMPLANT
SPONGE LAP 4X18 RFD (DISPOSABLE) ×3 IMPLANT
SUT MNCRL AB 4-0 PS2 18 (SUTURE) ×3 IMPLANT
SUT SILK 2 0 SH (SUTURE) IMPLANT
SUT VICRYL 3-0 CR8 SH (SUTURE) ×3 IMPLANT
SYR CONTROL 10ML LL (SYRINGE) ×3 IMPLANT
TOWEL GREEN STERILE FF (TOWEL DISPOSABLE) ×3 IMPLANT
TRAY FAXITRON CT DISP (TRAY / TRAY PROCEDURE) ×3 IMPLANT
TUBE CONNECTING 20'X1/4 (TUBING)
TUBE CONNECTING 20X1/4 (TUBING) IMPLANT
YANKAUER SUCT BULB TIP NO VENT (SUCTIONS) IMPLANT

## 2019-05-30 NOTE — Op Note (Signed)
Preoperative diagnosis: Right breast radial scar  Postoperative diagnosis: Same  Procedure: Right breast seed localized lumpectomy  Surgeon: Erroll Luna, MD  Anesthesia: LMA with 0.25% Marcaine local  EBL: 20 cc  Specimen: Right breast tissue with seed and clip verified by Faxitron  IV fluids: Per anesthesia record  Indications for procedure: Patient presents for right breast seed lumpectomy for complex sclerosing lesion diagnosed by mammography and core biopsy.  Risk, benefits and other treatment options and/or observation discussed.  Potential malignant risk of these lesions in the circumstance are up to 20%.  After discussion of the above she agreed to proceed.The procedure has been discussed with the patient. Alternatives to surgery have been discussed with the patient.  Risks of surgery include bleeding,  Infection,  Seroma formation, death,  and the need for further surgery.   The patient understands and wishes to proceed.  Description of procedure: The patient was met in the holding area and questions were answered.  The neoprobe was used to identify the seed in the right breast upper outer quadrant.  Films were available for review.  The right breast was marked as correct site.  Patient then taken back to the operative room.  She was placed supine upon the OR table.  After induction of general esthesia, the right breast was prepped and draped in sterile fashion timeout performed.  The neoprobe was used to identify the seed in the right upper outer quadrant.  A incision was made along the lateral border hall of the nipple areolar complex.  Dissection was carried laterally.  The tissue was identified with the neoprobe and all tissue around the seed and clip were excised with a grossly negative margin.  Hemostasis achieved with cautery.  Wound closed with 3-0 Vicryl for Monocryl.  Dermabond applied.  All counts were found to be correct.  The patient was awoke extubated taken to recovery in  satisfactory condition.

## 2019-05-30 NOTE — Anesthesia Procedure Notes (Signed)
Procedure Name: LMA Insertion Date/Time: 05/30/2019 7:39 AM Performed by: Modena Morrow, CRNA Pre-anesthesia Checklist: Patient identified, Emergency Drugs available, Suction available and Patient being monitored Patient Re-evaluated:Patient Re-evaluated prior to induction Oxygen Delivery Method: Circle system utilized Preoxygenation: Pre-oxygenation with 100% oxygen Induction Type: IV induction Ventilation: Mask ventilation without difficulty LMA: LMA inserted LMA Size: 4.0 Number of attempts: 1 Tube secured with: Tape Dental Injury: Teeth and Oropharynx as per pre-operative assessment

## 2019-05-30 NOTE — Anesthesia Preprocedure Evaluation (Signed)
Anesthesia Evaluation  Patient identified by MRN, date of birth, ID band Patient awake    History of Anesthesia Complications (+) PONV  Airway Mallampati: II  TM Distance: >3 FB     Dental   Pulmonary former smoker,    breath sounds clear to auscultation       Cardiovascular negative cardio ROS   Rhythm:Regular Rate:Normal     Neuro/Psych    GI/Hepatic negative GI ROS, Neg liver ROS,   Endo/Other    Renal/GU negative Renal ROS     Musculoskeletal   Abdominal   Peds  Hematology   Anesthesia Other Findings   Reproductive/Obstetrics                             Anesthesia Physical Anesthesia Plan  ASA: II  Anesthesia Plan: General   Post-op Pain Management:    Induction: Intravenous  PONV Risk Score and Plan: Ondansetron, Dexamethasone and Midazolam  Airway Management Planned: LMA  Additional Equipment:   Intra-op Plan:   Post-operative Plan: Extubation in OR  Informed Consent: I have reviewed the patients History and Physical, chart, labs and discussed the procedure including the risks, benefits and alternatives for the proposed anesthesia with the patient or authorized representative who has indicated his/her understanding and acceptance.     Dental advisory given  Plan Discussed with: CRNA and Anesthesiologist  Anesthesia Plan Comments:         Anesthesia Quick Evaluation

## 2019-05-30 NOTE — H&P (Signed)
Irving Shows Location: [facility] Patient #: [id number] DOB: 22-May-1965 Married / Language: English / Race: White Female  History of Present Illness Patient words: Patient presents at the request of the [facility] [place] /Dr. Curlene Dolphin to evaluate right breast mammographic abnormalities. She underwent 2 core biopsies. One area in the upper quadrant was fibrocystic change of microcalcifications. A second breast lesion in the right was a radial scar. She does have a family history of breast cancer on her father's side of her cousin and aunt. This is her first breast biopsy. She is sore from her biopsy but had no significant complications.               ADDENDUM: Pathology revealed FIBROCYSTIC CHANGE WITH CALCIFICATIONS of the Right breast, far lateral and posterior. This was found to be concordant by Dr. Curlene Dolphin.  Pathology results were discussed with the patient by telephone. The patient reported doing well after the biopsy with tenderness at the site. Post biopsy instructions and care were reviewed and questions were answered. The patient was encouraged to call [facility] for any additional concerns.  The patient has a biopsy proven COMPLEX SCLEROSING LESION, with excision recommended of the Right breast, 10 o'clock, and is scheduled to see Dr. Erroll Luna at [facility] on March 29, 2019.  Pathology results reported by Terie Purser, RN on 03/21/2019.          Diagnosis Breast, right, needle core biopsy, 10 o'clock - COMPLEX SCLEROSING LESION WITH FIBROCYSTIC CHANGE AND CALCIFICATIONS. - BENIGN SKELETAL MUSCLE.        Diagnosis Breast, right, needle core biopsy, far lateral and posterior - FIBROCYSTIC CHANGE WITH CALCIFICATIONS. - NO MALIGNANCY IDENTIFIED.  The patient is a 54 year old female.   Past Surgical History  Breast  Biopsy Right. multiple  Diagnostic Studies History Colonoscopy never Mammogram within last year Pap Smear 1-5 years ago  Allergies Codeine Phosphate *ANALGESICS - OPIOID* Allergies Reconciled  Medication History No Current Medications Medications Reconciled  Social History  Alcohol use Occasional alcohol use. Caffeine use Carbonated beverages, Coffee. No drug use Tobacco use Former smoker.  Family History Cancer Mother. Diabetes Mellitus Brother. Heart Disease Father. Heart disease in female family member before age 88 Respiratory Condition Mother.  Pregnancy / Birth History Age at menarche 30 years. Contraceptive History Intrauterine device. Gravida 2 Irregular periods Maternal age 66-35 Para 2  Other Problems ( Melanoma     Review of Systems General Not Present- Appetite Loss, Chills, Fatigue, Fever, Night Sweats, Weight Gain and Weight Loss. Skin Not Present- Change in Wart/Mole, Dryness, Hives, Jaundice, New Lesions, Non-Healing Wounds, Rash and Ulcer. HEENT Present- Wears glasses/contact lenses. Not Present- Earache, Hearing Loss, Hoarseness, Nose Bleed, Oral Ulcers, Ringing in the Ears, Seasonal Allergies, Sinus Pain, Sore Throat, Visual Disturbances and Yellow Eyes. Respiratory Not Present- Bloody sputum, Chronic Cough, Difficulty Breathing, Snoring and Wheezing. Breast Present- Breast Mass. Not Present- Breast Pain, Nipple Discharge and Skin Changes. Cardiovascular Not Present- Chest Pain, Difficulty Breathing Lying Down, Leg Cramps, Palpitations, Rapid Heart Rate, Shortness of Breath and Swelling of Extremities. Gastrointestinal Not Present- Abdominal Pain, Bloating, Bloody Stool, Change in Bowel Habits, Chronic diarrhea, Constipation, Difficulty Swallowing, Excessive gas, Gets full quickly at meals, Hemorrhoids, Indigestion, Nausea, Rectal Pain and Vomiting. Female Genitourinary Not Present- Frequency, Nocturia, Painful  Urination, Pelvic Pain and Urgency. Musculoskeletal Not Present- Back Pain, Joint Pain, Joint Stiffness, Muscle Pain, Muscle Weakness and Swelling of Extremities. Neurological  Not Present- Decreased Memory, Fainting, Headaches, Numbness, Seizures, Tingling, Tremor, Trouble walking and Weakness. Psychiatric Not Present- Anxiety, Bipolar, Change in Sleep Pattern, Depression, Fearful and Frequent crying. Endocrine Not Present- Cold Intolerance, Excessive Hunger, Hair Changes, Heat Intolerance, Hot flashes and New Diabetes. Hematology Present- Easy Bruising. Not Present- Blood Thinners, Excessive bleeding, Gland problems, HIV and Persistent Infections.  Vitals ( 03/29/2019 9:33 AM Weight: 132 lb Height: 62in Body Surface Area: 1.6 m Body Mass Index: 24.14 kg/m  Temp.: 98.55F  Pulse: 107 (Regular)  BP: 118/78 (Sitting, Left Arm, Standard)        Physical Exam   General Mental Status-Alert. General Appearance-Consistent with stated age. Hydration-Well hydrated. Voice-Normal.  Chest and Lung Exam Note: WOB normal no wheeze  Breast Breast - Left-Symmetric, Non Tender, No Biopsy scars, no Dimpling - Left, No Inflammation, No Lumpectomy scars, No Mastectomy scars, No Peau d' Orange. Breast - Right-Symmetric, Non Tender, No Biopsy scars, no Dimpling - Right, No Inflammation, No Lumpectomy scars, No Mastectomy scars, No Peau d' Orange. Breast Lump-No Palpable Breast Mass. Note: Minimal right breast biopsy change  Cardiovascular Note: NSR no heave  Neurologic Neurologic evaluation reveals -alert and oriented x 3 with no impairment of recent or remote memory. Mental Status-Normal.  Neuropsychiatric Examination of related systems reveals-The patient is well-nourished and well-groomed and strength and tone is normal overall with no atrophy, spasticity or temors.  Lymphatic Head & Neck  General Head & Neck Lymphatics: Bilateral -  Description - Normal. Axillary  General Axillary Region: Bilateral - Description - Normal. Tenderness - Non Tender.    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 Discussed observation as well with short-term follow-up. She is opted for lumpectomy. Risk of lumpectomy include bleeding, infection, seroma, more surgery, use of seed/wire, wound care, cosmetic deformity and the need for other treatments, death , blood clots, death. Pt agrees to proceed.  Current Plans You are being scheduled for surgery- Our schedulers will call you.  You should hear from our office's scheduling department within 5 working days about the location, date, and time of surgery. We try to make accommodations for patient's preferences in scheduling surgery, but sometimes the OR schedule or the surgeon's schedule prevents [place] from making those accommodations.  If you have not heard from our office [phone]) in 5 working days, call the office and ask for your surgeon's nurse.  If you have other questions about your diagnosis, plan, or surgery, call the office and ask for your surgeon's nurse.  Pt Education - [facility] Breast Biopsy HCI: discussed with patient and provided information.

## 2019-05-30 NOTE — Transfer of Care (Signed)
Immediate Anesthesia Transfer of Care Note  Patient: Brenda Ashley  Procedure(s) Performed: RIGHT BREAST LUMPECTOMY WITH RADIOACTIVE SEED LOCALIZATION (Right Breast)  Patient Location: PACU  Anesthesia Type:General  Level of Consciousness: drowsy and patient cooperative  Airway & Oxygen Therapy: Patient Spontanous Breathing and Patient connected to face mask oxygen  Post-op Assessment: Report given to RN and Post -op Vital signs reviewed and stable  Post vital signs: Reviewed and stable  Last Vitals:  Vitals Value Taken Time  BP 101/62 05/30/19 0816  Temp    Pulse 74 05/30/19 0818  Resp 17 05/30/19 0818  SpO2 100 % 05/30/19 0818  Vitals shown include unvalidated device data.  Last Pain:  Vitals:   05/30/19 0647  TempSrc: Oral  PainSc: 0-No pain      Patients Stated Pain Goal: 3 (05/30/19 6854)  Complications: No apparent anesthesia complications

## 2019-05-30 NOTE — Anesthesia Postprocedure Evaluation (Signed)
Anesthesia Post Note  Patient: Brenda Ashley  Procedure(s) Performed: RIGHT BREAST LUMPECTOMY WITH RADIOACTIVE SEED LOCALIZATION (Right Breast)     Patient location during evaluation: PACU Anesthesia Type: General Level of consciousness: awake Pain management: pain level controlled Respiratory status: spontaneous breathing Cardiovascular status: stable Postop Assessment: no apparent nausea or vomiting Anesthetic complications: no    Last Vitals:  Vitals:   05/30/19 0845 05/30/19 0907  BP: (!) 130/91 (!) 141/83  Pulse: 81 76  Resp: 12 16  Temp:  36.7 C  SpO2: 100% 97%    Last Pain:  Vitals:   05/30/19 0907  TempSrc: Oral  PainSc:                  [name]

## 2019-05-30 NOTE — Interval H&P Note (Signed)
History and Physical Interval Note:  05/30/2019 7:20 AM  Brenda Ashley  has presented today for surgery, with the diagnosis of RADIAL SCAR RIGHT BREAST.  The various methods of treatment have been discussed with the patient and family. After consideration of risks, benefits and other options for treatment, the patient has consented to  Procedure(s): RIGHT BREAST LUMPECTOMY WITH RADIOACTIVE SEED LOCALIZATION (Right) as a surgical intervention.  The patient's history has been reviewed, patient examined, no change in status, stable for surgery.  I have reviewed the patient's chart and labs.  Questions were answered to the patient's satisfaction.     Brenda Ashley

## 2019-05-30 NOTE — Discharge Instructions (Signed)
[facility] °Office Phone Number [phone] ° °BREAST BIOPSY/ PARTIAL MASTECTOMY: POST OP INSTRUCTIONS ° °Always review your discharge instruction sheet given to you by the facility where your surgery was performed. ° °IF YOU HAVE DISABILITY OR FAMILY LEAVE FORMS, YOU MUST BRING THEM TO THE OFFICE FOR PROCESSING.  DO NOT GIVE THEM TO YOUR DOCTOR. ° °1. A prescription for pain medication may be given to you upon discharge.  Take your pain medication as prescribed, if needed.  If narcotic pain medicine is not needed, then you may take acetaminophen (Tylenol) or ibuprofen (Advil) as needed. °2. Take your usually prescribed medications unless otherwise directed °3. If you need a refill on your pain medication, please contact your pharmacy.  They will contact our office to request authorization.  Prescriptions will not be filled after 5pm or on week-ends. °4. You should eat very light the first 24 hours after surgery, such as soup, crackers, pudding, etc.  Resume your normal diet the day after surgery. °5. Most patients will experience some swelling and bruising in the breast.  Ice packs and a good support bra will help.  Swelling and bruising can take several days to resolve.  °6. It is common to experience some constipation if taking pain medication after surgery.  Increasing fluid intake and taking a stool softener will usually help or prevent this problem from occurring.  A mild laxative (Milk of Magnesia or Miralax) should be taken according to package directions if there are no bowel movements after 48 hours. °7. Unless discharge instructions indicate otherwise, you may remove your bandages 24-48 hours after surgery, and you may shower at that time.  You may have steri-strips (small skin tapes) in place directly over the incision.  These strips should be left on the skin for 7-10 days.  If your surgeon used skin glue on the incision, you may shower in 24 hours.  The glue will flake off over the  next 2-3 weeks.  Any sutures or staples will be removed at the office during your follow-up visit. °8. ACTIVITIES:  You may resume regular daily activities (gradually increasing) beginning the next day.  Wearing a good support bra or sports bra minimizes pain and swelling.  You may have sexual intercourse when it is comfortable. °a. You may drive when you no longer are taking prescription pain medication, you can comfortably wear a seatbelt, and you can safely maneuver your car and apply brakes. °b. RETURN TO WORK:  ______________________________________________________________________________________ °9. You should see your doctor in the office for a follow-up appointment approximately two weeks after your surgery.  Your doctor’s nurse will typically make your follow-up appointment when she calls you with your pathology report.  Expect your pathology report 2-3 business days after your surgery.  You may call to check if you do not hear from us after three days. °10. OTHER INSTRUCTIONS: _______________________________________________________________________________________________ _____________________________________________________________________________________________________________________________________ °_____________________________________________________________________________________________________________________________________ °_____________________________________________________________________________________________________________________________________ ° °WHEN TO CALL YOUR DOCTOR: °1. Fever over 101.0 °2. Nausea and/or vomiting. °3. Extreme swelling or bruising. °4. Continued bleeding from incision. °5. Increased pain, redness, or drainage from the incision. ° °The clinic staff is available to answer your questions during regular business hours.  Please don’t hesitate to call and ask to speak to one of the nurses for clinical concerns.  If you have a medical emergency, go to the nearest  emergency room or call 911.  A surgeon from [facility] is always on call at the hospital. ° °For further questions, please visit [email or web address]  ° ° ° ° °  Post Anesthesia Home Care Instructions ° °Activity: °Get plenty of rest for the remainder of the day. A responsible individual must stay with you for 24 hours following the procedure.  °For the next 24 hours, DO NOT: °-Drive a car °-Operate machinery °-Drink alcoholic beverages °-Take any medication unless instructed by your physician °-Make any legal decisions or sign important papers. ° °Meals: °Start with liquid foods such as gelatin or soup. Progress to regular foods as tolerated. Avoid greasy, spicy, heavy foods. If nausea and/or vomiting occur, drink only clear liquids until the nausea and/or vomiting subsides. Call your physician if vomiting continues. ° °Special Instructions/Symptoms: °Your throat may feel dry or sore from the anesthesia or the breathing tube placed in your throat during surgery. If this causes discomfort, gargle with warm salt water. The discomfort should disappear within 24 hours. ° °If you had a scopolamine patch placed behind your ear for the management of post- operative nausea and/or vomiting: ° °1. The medication in the patch is effective for 72 hours, after which it should be removed.  Wrap patch in a tissue and discard in the trash. Wash hands thoroughly with soap and water. °2. You may remove the patch earlier than 72 hours if you experience unpleasant side effects which may include dry mouth, dizziness or visual disturbances. °3. Avoid touching the patch. Wash your hands with soap and water after contact with the patch. °  ° °

## 2019-05-31 ENCOUNTER — Encounter: Payer: Self-pay | Admitting: *Deleted

## 2019-06-03 LAB — SURGICAL PATHOLOGY

## 2019-06-20 ENCOUNTER — Ambulatory Visit: Payer: Managed Care, Other (non HMO)

## 2019-06-24 ENCOUNTER — Other Ambulatory Visit: Payer: Self-pay

## 2019-06-24 ENCOUNTER — Encounter: Payer: Self-pay | Admitting: Physician Assistant

## 2019-06-24 ENCOUNTER — Ambulatory Visit (INDEPENDENT_AMBULATORY_CARE_PROVIDER_SITE_OTHER): Payer: Managed Care, Other (non HMO) | Admitting: Physician Assistant

## 2019-06-24 DIAGNOSIS — Z23 Encounter for immunization: Secondary | ICD-10-CM | POA: Diagnosis not present

## 2019-06-24 NOTE — Progress Notes (Signed)
Patient presented today for 2nd shingrix vaccine. Vaccine given in L deltoid. Patient tolerated vaccine well. AS, CMA

## 2020-05-18 ENCOUNTER — Other Ambulatory Visit: Payer: Self-pay | Admitting: Physician Assistant

## 2020-05-18 DIAGNOSIS — Z1231 Encounter for screening mammogram for malignant neoplasm of breast: Secondary | ICD-10-CM

## 2020-05-28 ENCOUNTER — Encounter: Payer: Managed Care, Other (non HMO) | Admitting: Physician Assistant

## 2020-06-02 DIAGNOSIS — Z1231 Encounter for screening mammogram for malignant neoplasm of breast: Secondary | ICD-10-CM

## 2020-07-30 ENCOUNTER — Other Ambulatory Visit: Payer: Self-pay

## 2020-07-30 ENCOUNTER — Ambulatory Visit
Admission: RE | Admit: 2020-07-30 | Discharge: 2020-07-30 | Disposition: A | Discharge status: Home / Self Care | Payer: Managed Care, Other (non HMO) | Source: Ambulatory Visit | Attending: Physician Assistant | Admitting: Physician Assistant

## 2020-07-30 DIAGNOSIS — Z1231 Encounter for screening mammogram for malignant neoplasm of breast: Secondary | ICD-10-CM

## 2020-08-05 ENCOUNTER — Encounter: Payer: Self-pay | Admitting: Nurse Practitioner

## 2020-08-11 ENCOUNTER — Encounter (HOSPITAL_COMMUNITY): Payer: Self-pay

## 2020-10-25 IMAGING — US ULTRASOUND RIGHT BREAST LIMITED
1 series · 13 of 14 positions shown · non-contrast
Comparison: Screening mammogram December 27, 2017

CLINICAL DATA: 52-year-old patient recalled from recent screening
mammogram for evaluation of 2 possible right breast masses.

EXAM:
DIGITAL DIAGNOSTIC RIGHT MAMMOGRAM WITH TOMO
ULTRASOUND RIGHT BREAST

[Series 1: ultrasound right breast limited · 0.04mm/px · 13 of 14 slices shown]
[im 1/14]
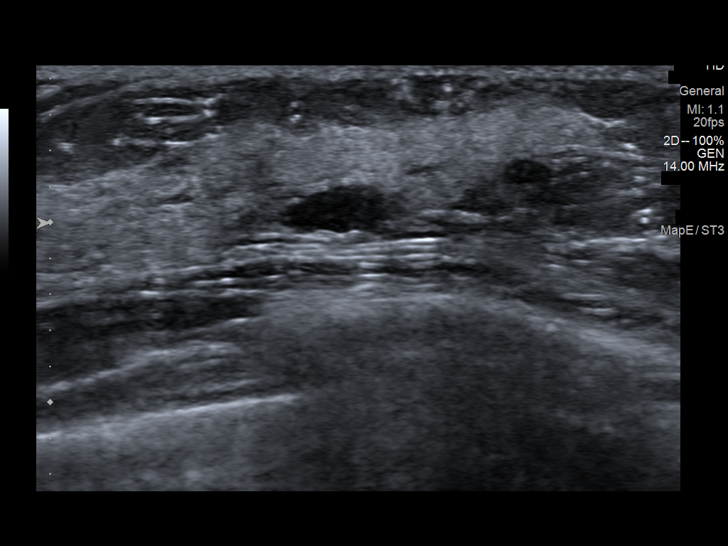
[im 2/14]
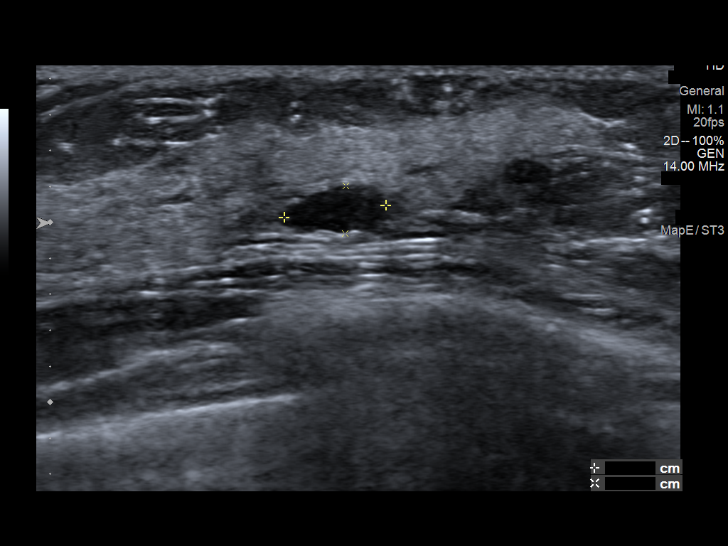
[im 3/14]
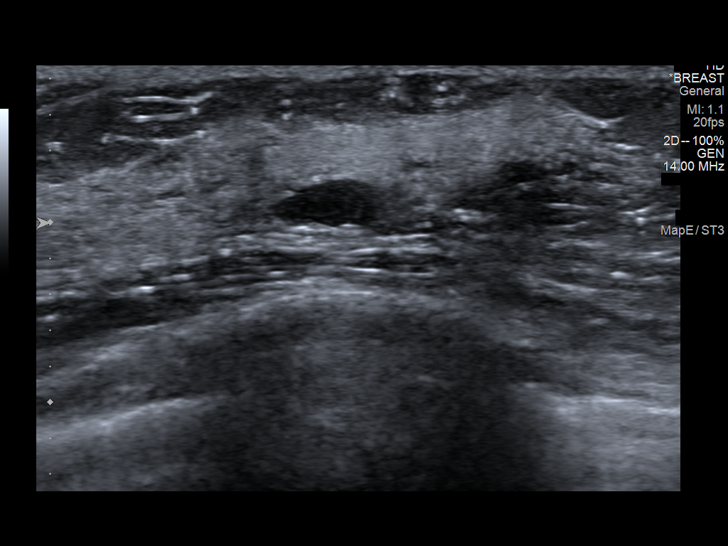
[im 4/14]
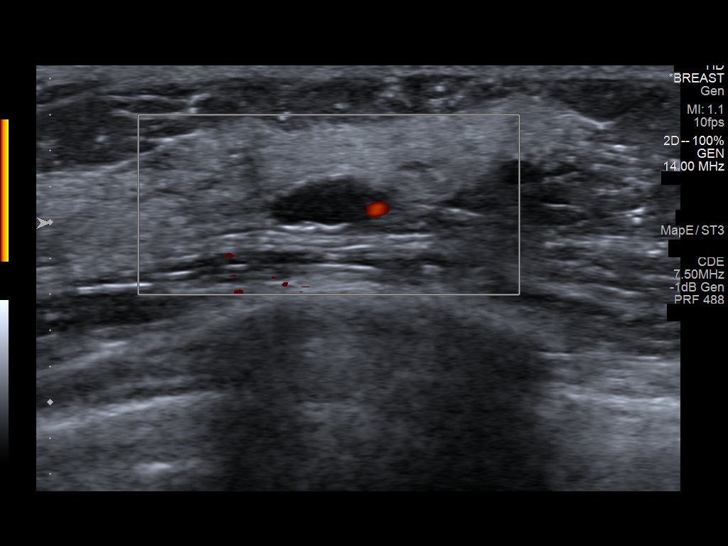
[im 5/14]
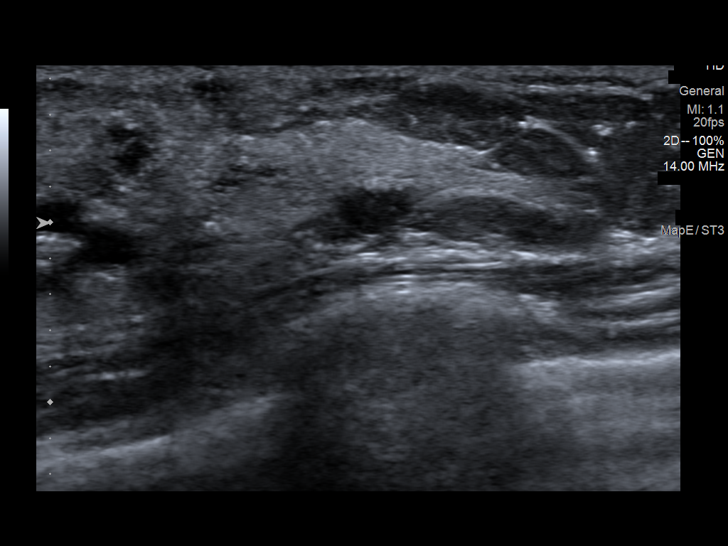
[im 6/14]
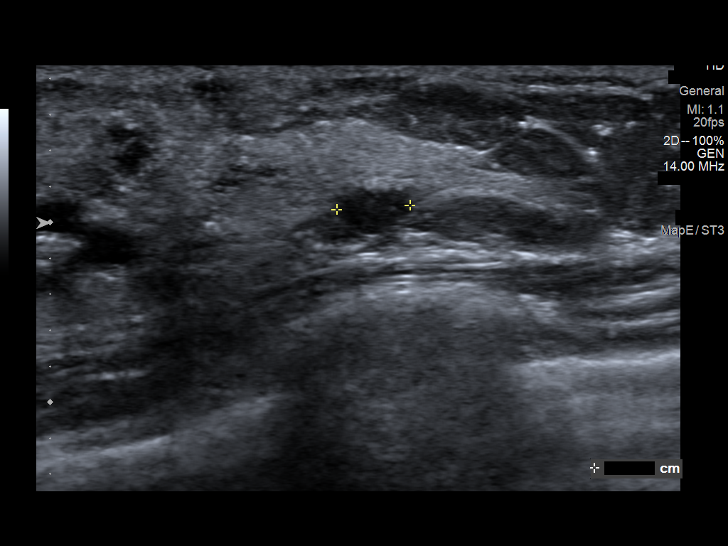
[im 8/14]
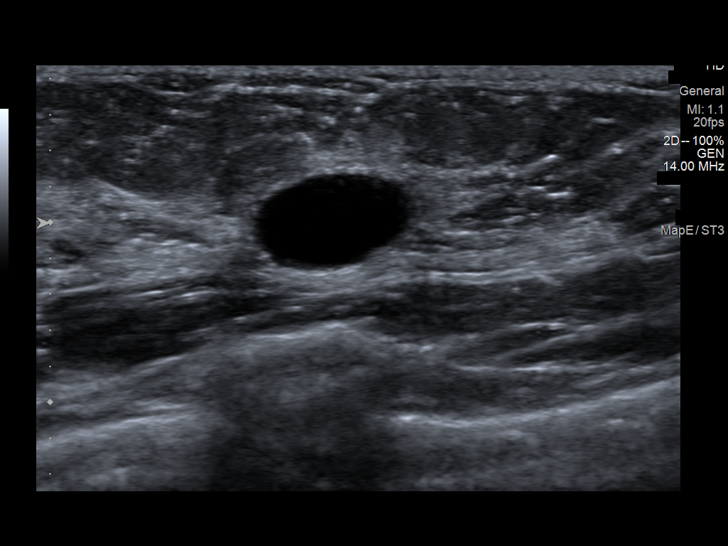
[im 9/14]
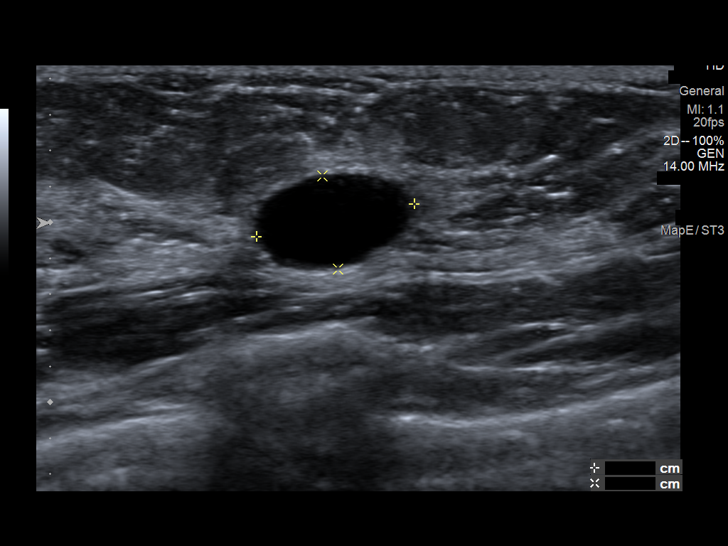
[im 10/14]
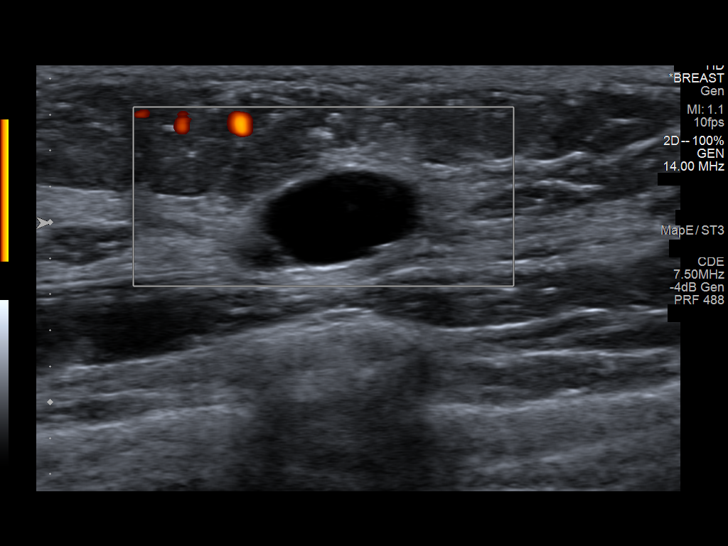
[im 11/14]
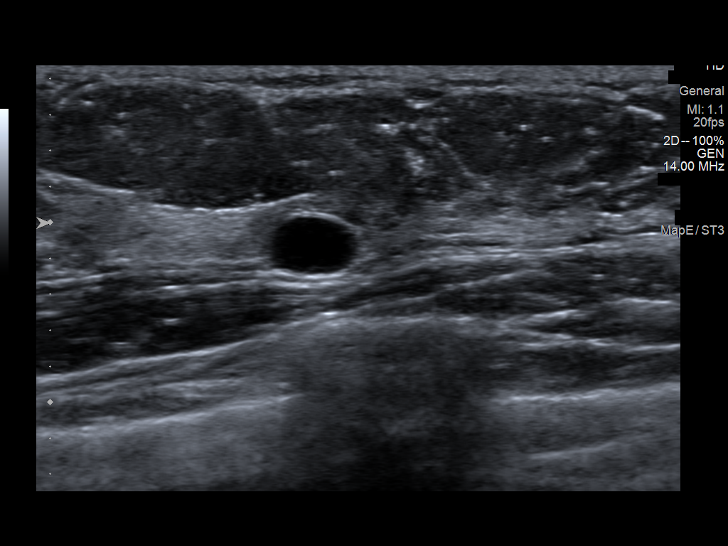
[im 12/14]
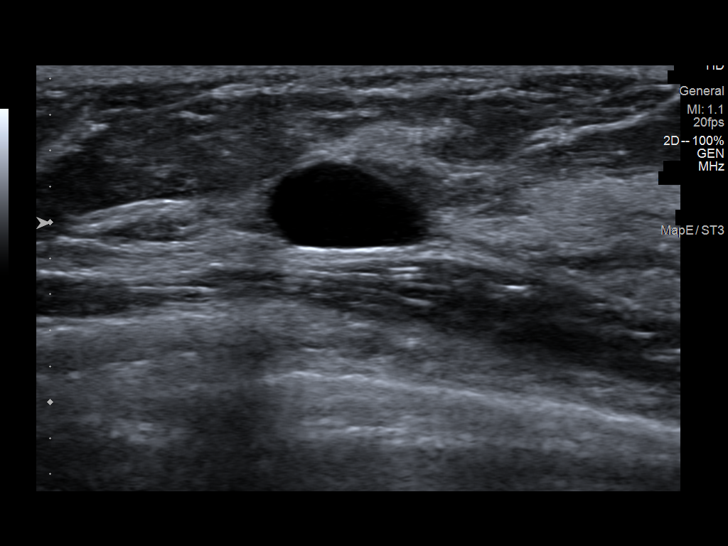
[im 13/14]
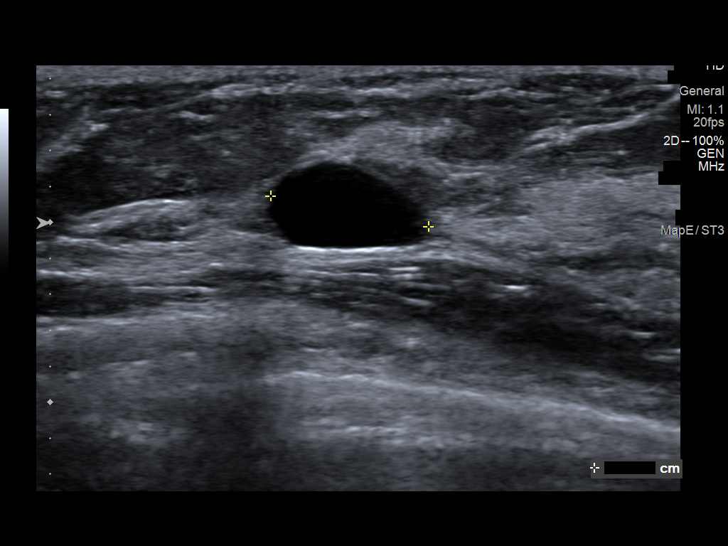
[im 14/14]
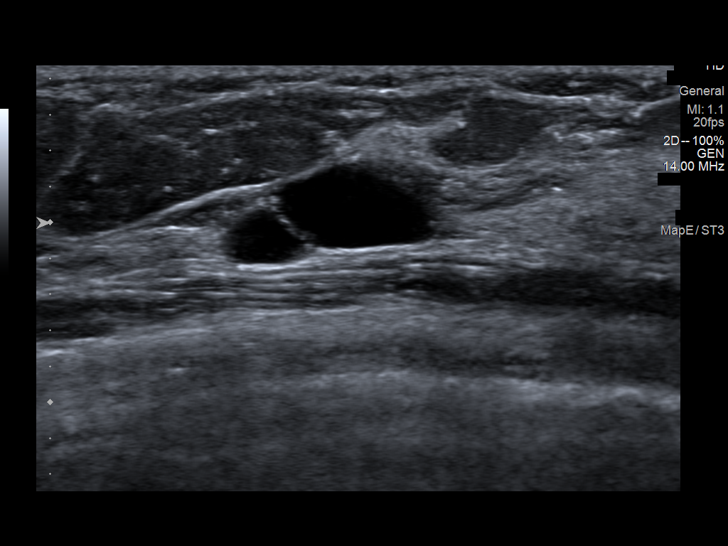

[13 of 14 positions shown; findings below may reference images not displayed]

ACR Breast Density Category c: The breast tissue is heterogeneously
dense, which may obscure small masses.
FINDINGS: Spot compression views of the outer right breast confirm a
circumscribed approximately 0.9 cm mass. In the retroareolar right
breast is a smaller circumscribed and gently lobulated 0.6 cm mass.

On physical exam, no mass is palpated in the right breast.

Targeted ultrasound is performed, showing a simple cyst at 9 o'clock
position 2 cm from the nipple measuring 0.9 x 0.5 x 0.9 cm. This
accounts for the larger mass seen on the mammogram.

In the retroareolar right breast at 6 o'clock position is a mildly
complicated 0.6 x 0.3 x 0.4 cm cyst.
IMPRESSION: Two benign cysts in the right breast.  No evidence of malignancy.

RECOMMENDATION:
Screening mammogram in one year.(Code:QS-7-ITC)

I have discussed the findings and recommendations with the patient.
Results were also provided in writing at the conclusion of the
visit. If applicable, a reminder letter will be sent to the patient
regarding the next appointment.

BI-RADS CATEGORY  2: Benign.

## 2020-10-25 IMAGING — MG DIGITAL DIAGNOSTIC UNILATERAL RIGHT MAMMOGRAM WITH TOMO AND CAD
8 series · 8 of 24 positions shown · non-contrast
Comparison: Screening mammogram December 27, 2017

CLINICAL DATA: 52-year-old patient recalled from recent screening
mammogram for evaluation of 2 possible right breast masses.

EXAM:
DIGITAL DIAGNOSTIC RIGHT MAMMOGRAM WITH TOMO
ULTRASOUND RIGHT BREAST

[R CC synth-2D (1 of 2)]
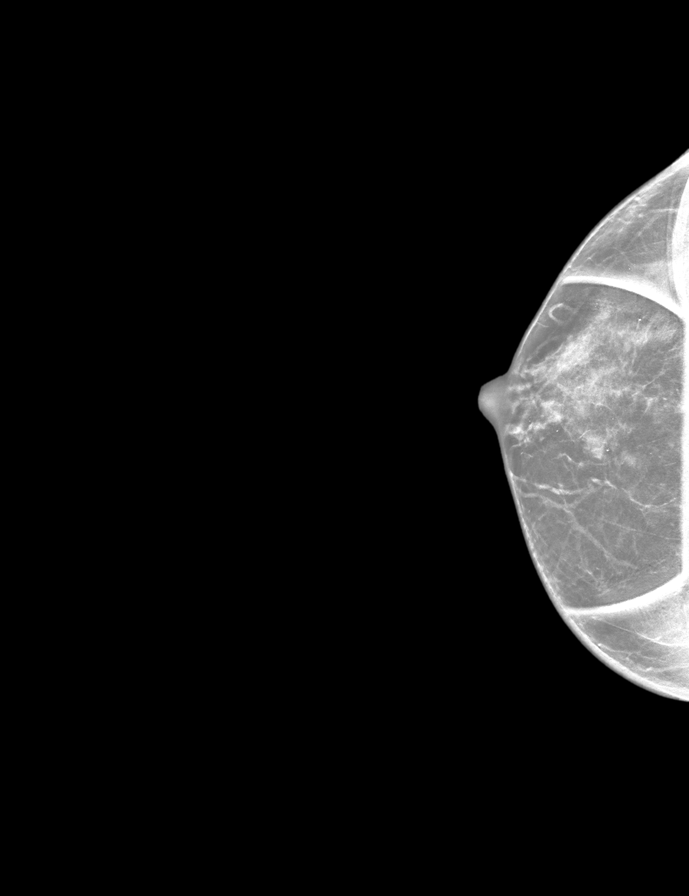

[R ML synth-2D]
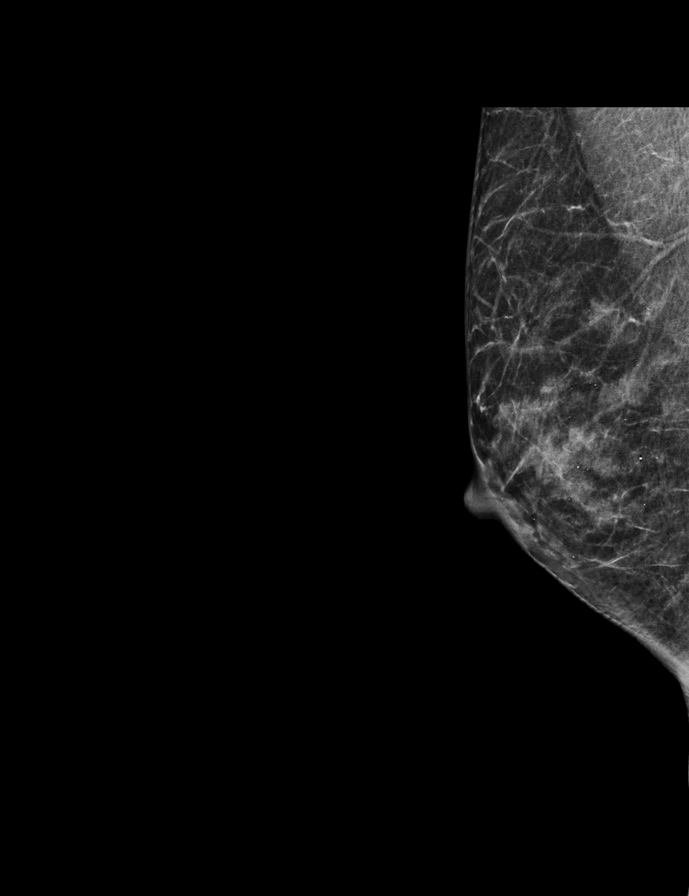

[R MLO synth-2D]
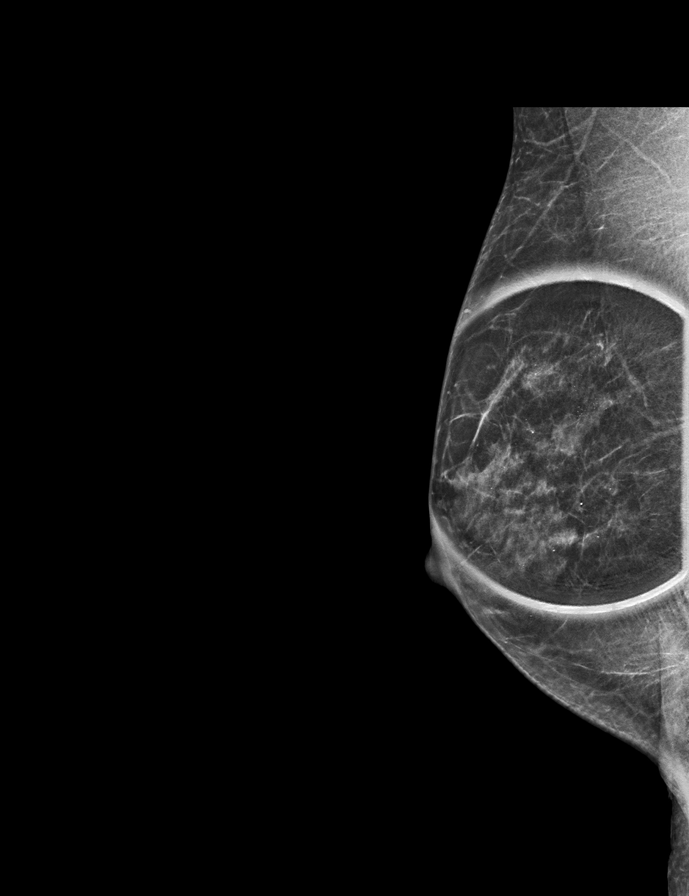

[R CC synth-2D (2 of 2)]
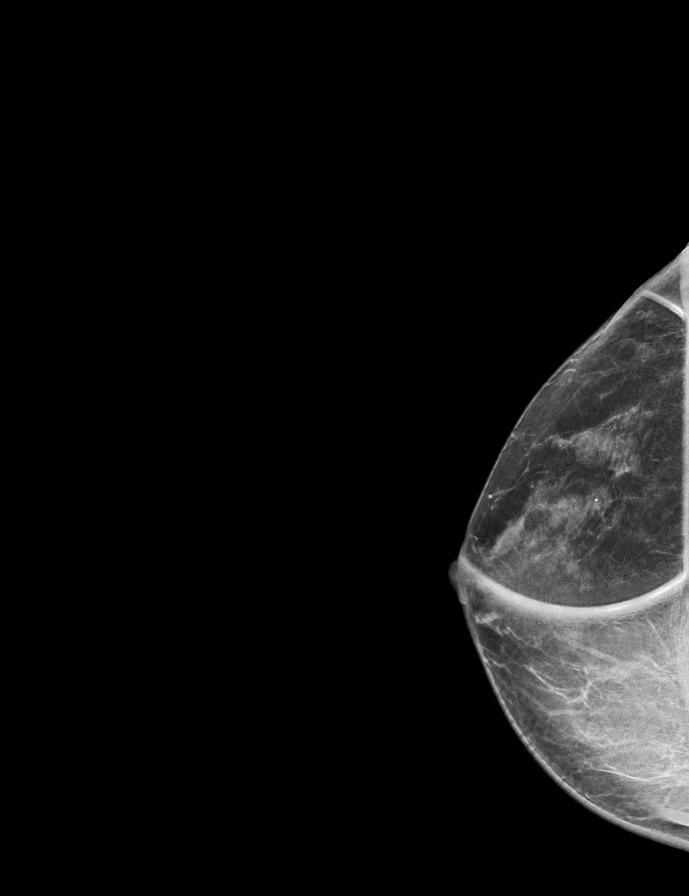

[R ML tomo · tomo slice 25/48.0]
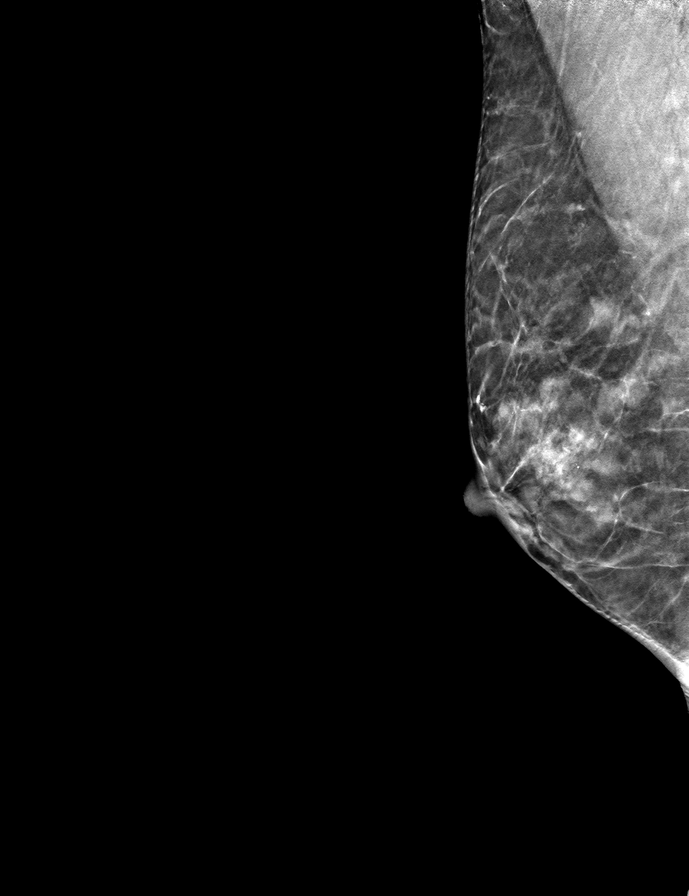

[R CC tomo (1 of 2) · tomo slice 23/46.0]
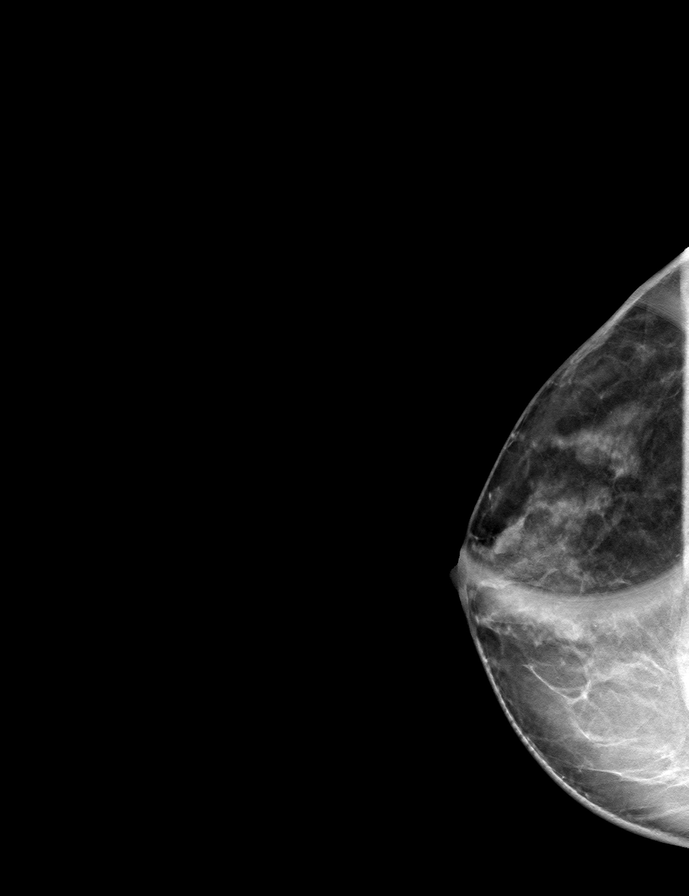

[R MLO tomo · tomo slice 25/50.0]
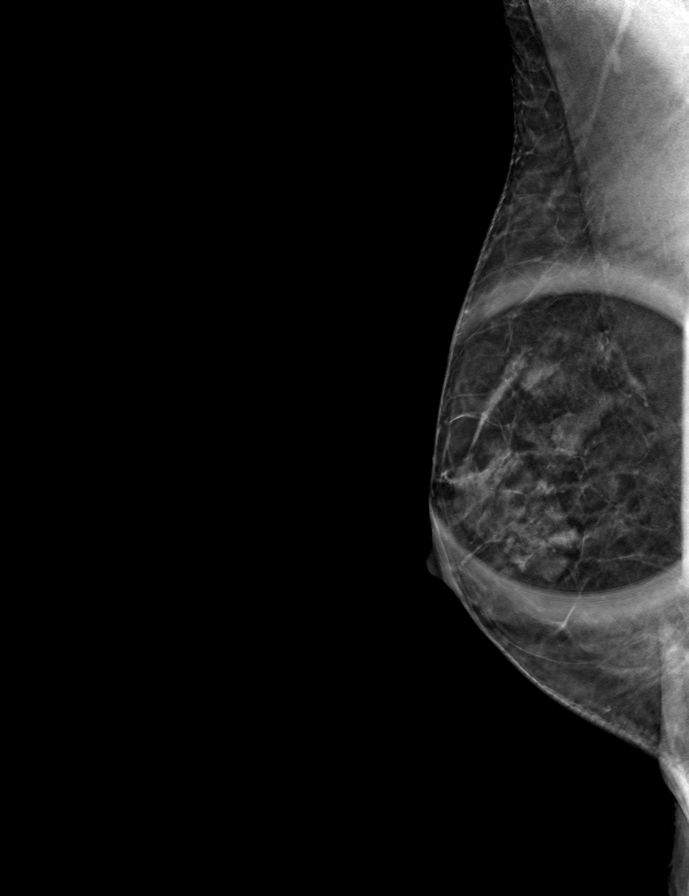

[R CC tomo (2 of 2) · tomo slice 25/48.0]
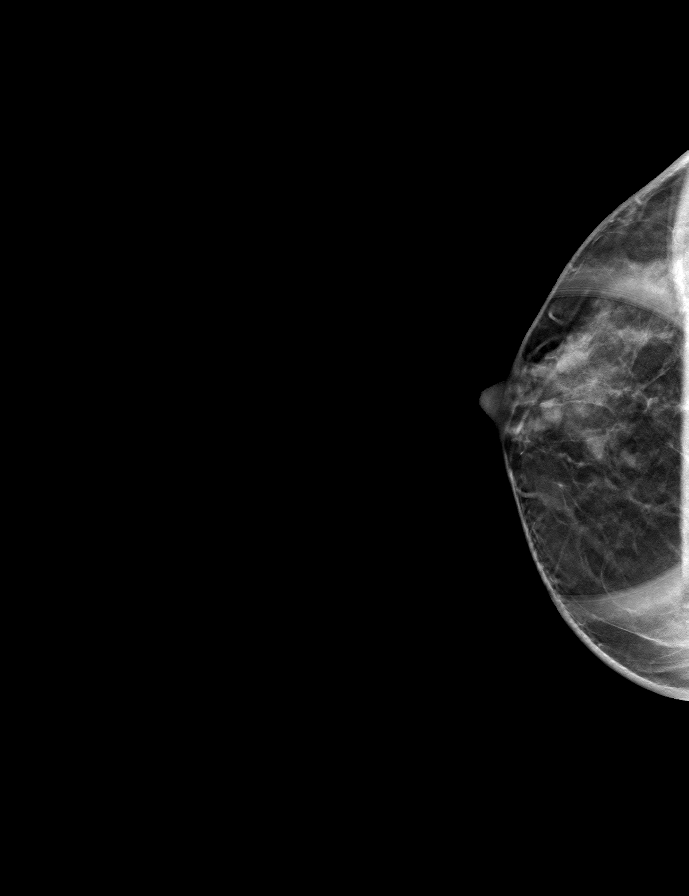

[8 of 24 positions shown; findings below may reference images not displayed]

ACR Breast Density Category c: The breast tissue is heterogeneously
dense, which may obscure small masses.
FINDINGS: Spot compression views of the outer right breast confirm a
circumscribed approximately 0.9 cm mass. In the retroareolar right
breast is a smaller circumscribed and gently lobulated 0.6 cm mass.

On physical exam, no mass is palpated in the right breast.

Targeted ultrasound is performed, showing a simple cyst at 9 o'clock
position 2 cm from the nipple measuring 0.9 x 0.5 x 0.9 cm. This
accounts for the larger mass seen on the mammogram.

In the retroareolar right breast at 6 o'clock position is a mildly
complicated 0.6 x 0.3 x 0.4 cm cyst.
IMPRESSION: Two benign cysts in the right breast.  No evidence of malignancy.

RECOMMENDATION:
Screening mammogram in one year.(Code:QS-7-ITC)

I have discussed the findings and recommendations with the patient.
Results were also provided in writing at the conclusion of the
visit. If applicable, a reminder letter will be sent to the patient
regarding the next appointment.

BI-RADS CATEGORY  2: Benign.

## 2020-11-13 ENCOUNTER — Encounter: Payer: Self-pay | Admitting: Nurse Practitioner

## 2020-11-13 ENCOUNTER — Ambulatory Visit (INDEPENDENT_AMBULATORY_CARE_PROVIDER_SITE_OTHER): Payer: Managed Care, Other (non HMO) | Admitting: Nurse Practitioner

## 2020-11-13 ENCOUNTER — Other Ambulatory Visit: Payer: Self-pay

## 2020-11-13 VITALS — BP 104/71 | HR 72 | Temp 98.2°F | Ht 62.0 in | Wt 109.6 lb

## 2020-11-13 DIAGNOSIS — Z23 Encounter for immunization: Secondary | ICD-10-CM

## 2020-11-13 DIAGNOSIS — Z0000 Encounter for general adult medical examination without abnormal findings: Secondary | ICD-10-CM

## 2020-11-13 DIAGNOSIS — E559 Vitamin D deficiency, unspecified: Secondary | ICD-10-CM

## 2020-11-13 DIAGNOSIS — N951 Menopausal and female climacteric states: Secondary | ICD-10-CM

## 2020-11-13 NOTE — Progress Notes (Signed)
Established Patient Office Visit  Subjective:  Patient ID: Brenda Ashley, female    DOB: February 14, 1966  Age: 55 y.o. MRN: [id number]  CC:  Chief Complaint  Patient presents with   Annual Exam    HPI Brenda Ashley presents for annual wellness visit today. She states that she is doing well. She is fasting and would like to get routine, fasting labs while at today's visit. She would also like her flu shot. She does see GYN provider for well woman care and mammograms. She had negative cologuard in 2021.  She has no physical concerns or complaints today. She denies chest pain, chest pressure, or shortness of breath. Se denies headaches or visual disturbances. She denies abdominal pain, nausea, vomiting, or changes in bowel or bladder habits.    Past Medical History:  Diagnosis Date   Complication of anesthesia    PONV (postoperative nausea and vomiting)     Past Surgical History:  Procedure Laterality Date   BREAST LUMPECTOMY WITH RADIOACTIVE SEED LOCALIZATION Right 05/30/2019   Procedure: RIGHT BREAST LUMPECTOMY WITH RADIOACTIVE SEED LOCALIZATION;  Surgeon: Erroll Luna, MD;  Location: [facility];  Service: General;  Laterality: Right;   VULVA SURGERY  2019   wide excisional biopsy   WRIST FRACTURE SURGERY  2010    Family History  Problem Relation Age of Onset   Breast cancer Maternal Aunt    Breast cancer Cousin    Lung cancer Mother    Heart attack Father    Depression Father    Hyperlipidemia Father    Diabetes Brother    Hyperlipidemia Brother     Social History   Socioeconomic History   Marital status: Married    Spouse name: Not on file   Number of children: Not on file   Years of education: Not on file   Highest education level: Not on file  Occupational History   Not on file  Tobacco Use   Smoking status: Former    Packs/day: 0.50    Years: 15.00    Pack years: 7.50    Types: Cigarettes    Quit date: 1993    Years since  quitting: 29.7   Smokeless tobacco: Never  Vaping Use   Vaping Use: Never used  Substance and Sexual Activity   Alcohol use: Yes    Comment: weekends   Drug use: No   Sexual activity: Yes    Partners: Male    Birth control/protection: I.U.D.  Other Topics Concern   Not on file  Social History Narrative   Not on file   Social Determinants of Health   Financial Resource Strain: Not on file  Food Insecurity: Not on file  Transportation Needs: Not on file  Physical Activity: Not on file  Stress: Not on file  Social Connections: Not on file  Intimate Partner Violence: Not on file    Outpatient Medications Prior to Visit  Medication Sig Dispense Refill   Cholecalciferol (VITAMIN D3) 5000 units TABS 5,000 IU OTC vitamin D3 daily. 90 tablet 3   Multiple Vitamin (MULTIVITAMIN) capsule Take 1 capsule by mouth daily.     HYDROcodone-acetaminophen (NORCO/VICODIN) 5-325 MG tablet Take 1 tablet by mouth every 6 (six) hours as needed for moderate pain. 10 tablet 0   ibuprofen (ADVIL) 800 MG tablet Take 1 tablet (800 mg total) by mouth every 8 (eight) hours as needed. 30 tablet 0   No facility-administered medications prior to visit.    Allergies  Allergen  Reactions   Codeine Nausea And Vomiting    ROS Review of Systems  Constitutional:  Negative for activity change, appetite change, chills, fatigue and fever.  HENT:  Negative for congestion, postnasal drip, rhinorrhea, sinus pressure, sinus pain, sneezing and sore throat.   Eyes: Negative.   Respiratory:  Negative for cough, chest tightness, shortness of breath and wheezing.   Cardiovascular:  Negative for chest pain and palpitations.  Gastrointestinal:  Negative for abdominal pain, constipation, diarrhea, nausea and vomiting.  Endocrine: Negative for cold intolerance, heat intolerance, polydipsia and polyuria.  Genitourinary:  Negative for dyspareunia, dysuria, flank pain, frequency and urgency.  Musculoskeletal:  Negative for  arthralgias, back pain and myalgias.  Skin:  Negative for rash.  Allergic/Immunologic: Negative for environmental allergies.  Neurological:  Negative for dizziness, weakness and headaches.  Hematological:  Negative for adenopathy.  Psychiatric/Behavioral:  The patient is not nervous/anxious.      Objective:    Physical Exam Vitals and nursing note reviewed.  Constitutional:      Appearance: Normal appearance. She is well-developed.  HENT:     Head: Normocephalic and atraumatic.     Right Ear: Tympanic membrane, ear canal and external ear normal.     Left Ear: Tympanic membrane, ear canal and external ear normal.     Nose: Nose normal.     Mouth/Throat:     Mouth: Mucous membranes are dry.     Pharynx: Oropharynx is clear.  Eyes:     Extraocular Movements: Extraocular movements intact.     Conjunctiva/sclera: Conjunctivae normal.     Pupils: Pupils are equal, round, and reactive to light.  Neck:     Vascular: No carotid bruit.  Cardiovascular:     Rate and Rhythm: Normal rate and regular rhythm.     Pulses: Normal pulses.     Heart sounds: Normal heart sounds.  Pulmonary:     Effort: Pulmonary effort is normal.     Breath sounds: Normal breath sounds.  Abdominal:     General: Bowel sounds are normal. There is no distension.     Palpations: Abdomen is soft. There is no mass.     Tenderness: There is no abdominal tenderness. There is no guarding or rebound.     Hernia: No hernia is present.  Musculoskeletal:        General: Normal range of motion.     Cervical back: Normal range of motion and neck supple.  Lymphadenopathy:     Cervical: No cervical adenopathy.  Skin:    General: Skin is warm and dry.     Capillary Refill: Capillary refill takes less than 2 seconds.  Neurological:     General: No focal deficit present.     Mental Status: She is alert and oriented to person, place, and time.  Psychiatric:        Mood and Affect: Mood normal.        Behavior: Behavior  normal.        Thought Content: Thought content normal.        Judgment: Judgment normal.   Today's Vitals   11/13/20 0848  BP: 104/71  Pulse: 72  Temp: 98.2 F (36.8 C)  SpO2: 99%  Weight: 109 lb 9.6 oz (49.7 kg)  Height: _0  (1.575 m)   Body mass index is 20.05 kg/m.   Wt Readings from Last 3 Encounters:  11/13/20 109 lb 9.6 oz (49.7 kg)  06/24/19 130 lb (59 kg)  05/30/19 131 lb 13.4 oz (  59.8 kg)     Health Maintenance Due  Topic Date Due   Hepatitis C Screening  Never done   PAP SMEAR-Modifier  12/09/2019   COVID-19 Vaccine (4 - Booster for Moderna series) 05/07/2020    There are no preventive care reminders to display for this patient.  Lab Results  Component Value Date   TSH 1.100 11/13/2020   Lab Results  Component Value Date   WBC 4.3 11/13/2020   HGB 14.9 11/13/2020   HCT 45.1 11/13/2020   MCV 98 (H) 11/13/2020   PLT 206 11/13/2020   Lab Results  Component Value Date   NA 141 11/13/2020   K 4.2 11/13/2020   CO2 21 11/13/2020   GLUCOSE 90 11/13/2020   BUN 22 11/13/2020   CREATININE 0.73 11/13/2020   BILITOT 0.7 11/13/2020   ALKPHOS 65 11/13/2020   AST 19 11/13/2020   ALT 16 11/13/2020   PROT 7.0 11/13/2020   ALBUMIN 4.7 11/13/2020   CALCIUM 9.5 11/13/2020   ANIONGAP 8 05/27/2019   EGFR 98 11/13/2020   Lab Results  Component Value Date   CHOL 225 (H) 11/13/2020   Lab Results  Component Value Date   HDL 98 11/13/2020   Lab Results  Component Value Date   LDLCALC 120 (H) 11/13/2020   Lab Results  Component Value Date   TRIG 39 11/13/2020   Lab Results  Component Value Date   CHOLHDL 2.3 11/13/2020   Lab Results  Component Value Date   HGBA1C 5.2 12/26/2018      Assessment & Plan:  1. Encounter for wellness examination Annual wellness visit today.  Routine, fasting labs obtained today.  We will discuss results with patient.  When results are available. - CBC with Differential/Platelet - Comprehensive metabolic panel -  T4, free - TSH - Lipid panel  2. Perimenopause Will check FSH, LH, and estradiol for further evaluation. - FSH/LH - Estradiol  3. Vitamin D deficiency Check vitamin D level with routine, fasting labs.  Treat deficiency as indicated. - Vitamin D (25 hydroxy)  4. Need for influenza vaccination Flu vaccine administered during today's visit. - Flu Vaccine QUAD 6+ mos PF IM (Fluarix Quad PF)   Problem List Items Addressed This Visit       Other   Vitamin D deficiency   Relevant Orders   Vitamin D (25 hydroxy)   Encounter for wellness examination - Primary   Relevant Orders   CBC with Differential/Platelet (Completed)   Comprehensive metabolic panel (Completed)   T4, free (Completed)   TSH (Completed)   Lipid panel (Completed)   Need for influenza vaccination   Relevant Orders   Flu Vaccine QUAD 6+ mos PF IM (Fluarix Quad PF) (Completed)   Perimenopause   Relevant Orders   FSH/LH   Estradiol    No orders of the defined types were placed in this encounter.   Follow-up: Return in about 1 year (around 11/13/2021) for health maintenance exam, FBW at time of visit .    Ronnell Freshwater, NP

## 2020-11-14 LAB — CBC WITH DIFFERENTIAL/PLATELET
Basophils Absolute: 0 10*3/uL (ref 0.0–0.2)
Basos: 1 %
EOS (ABSOLUTE): 0.1 10*3/uL (ref 0.0–0.4)
Eos: 1 %
Hematocrit: 45.1 % (ref 34.0–46.6)
Hemoglobin: 14.9 g/dL (ref 11.1–15.9)
Immature Grans (Abs): 0 10*3/uL (ref 0.0–0.1)
Immature Granulocytes: 0 %
Lymphocytes Absolute: 2 10*3/uL (ref 0.7–3.1)
Lymphs: 45 %
MCH: 32.3 pg (ref 26.6–33.0)
MCHC: 33 g/dL (ref 31.5–35.7)
MCV: 98 fL — ABNORMAL HIGH (ref 79–97)
Monocytes Absolute: 0.4 10*3/uL (ref 0.1–0.9)
Monocytes: 8 %
Neutrophils Absolute: 1.9 10*3/uL (ref 1.4–7.0)
Neutrophils: 45 %
Platelets: 206 10*3/uL (ref 150–450)
RBC: 4.61 x10E6/uL (ref 3.77–5.28)
RDW: 12.2 % (ref 11.7–15.4)
WBC: 4.3 10*3/uL (ref 3.4–10.8)

## 2020-11-14 LAB — COMPREHENSIVE METABOLIC PANEL
ALT: 16 IU/L (ref 0–32)
AST: 19 IU/L (ref 0–40)
Albumin/Globulin Ratio: 2 (ref 1.2–2.2)
Albumin: 4.7 g/dL (ref 3.8–4.9)
Alkaline Phosphatase: 65 IU/L (ref 44–121)
BUN/Creatinine Ratio: 30 — ABNORMAL HIGH (ref 9–23)
BUN: 22 mg/dL (ref 6–24)
Bilirubin Total: 0.7 mg/dL (ref 0.0–1.2)
CO2: 21 mmol/L (ref 20–29)
Calcium: 9.5 mg/dL (ref 8.7–10.2)
Chloride: 104 mmol/L (ref 96–106)
Creatinine, Ser: 0.73 mg/dL (ref 0.57–1.00)
Globulin, Total: 2.3 g/dL (ref 1.5–4.5)
Glucose: 90 mg/dL (ref 70–99)
Potassium: 4.2 mmol/L (ref 3.5–5.2)
Sodium: 141 mmol/L (ref 134–144)
Total Protein: 7 g/dL (ref 6.0–8.5)
eGFR: 98 mL/min/{1.73_m2} (ref >59)

## 2020-11-14 LAB — LIPID PANEL
Chol/HDL Ratio: 2.3 ratio (ref 0.0–4.4)
Cholesterol, Total: 225 mg/dL — ABNORMAL HIGH (ref 100–199)
HDL: 98 mg/dL (ref >39)
LDL Chol Calc (NIH): 120 mg/dL — ABNORMAL HIGH (ref 0–99)
Triglycerides: 39 mg/dL (ref 0–149)
VLDL Cholesterol Cal: 7 mg/dL (ref 5–40)

## 2020-11-14 LAB — T4, FREE: Free T4: 1.24 ng/dL (ref 0.82–1.77)

## 2020-11-14 LAB — VITAMIN D 25 HYDROXY (VIT D DEFICIENCY, FRACTURES): Vit D, 25-Hydroxy: 50.8 ng/mL (ref 30.0–100.0)

## 2020-11-14 LAB — FSH/LH
FSH: 117 m[IU]/mL
LH: 40.1 m[IU]/mL

## 2020-11-14 LAB — ESTRADIOL: Estradiol: <5 pg/mL

## 2020-11-14 LAB — TSH: TSH: 1.1 u[IU]/mL (ref 0.450–4.500)

## 2020-11-16 ENCOUNTER — Encounter: Payer: Self-pay | Admitting: Nurse Practitioner

## 2020-11-19 NOTE — Telephone Encounter (Signed)
Can we call her to schedule appointment to address her labs? Thanks

## 2020-11-24 ENCOUNTER — Encounter: Payer: Self-pay | Admitting: Nurse Practitioner

## 2020-11-24 ENCOUNTER — Ambulatory Visit (INDEPENDENT_AMBULATORY_CARE_PROVIDER_SITE_OTHER): Payer: Managed Care, Other (non HMO) | Admitting: Nurse Practitioner

## 2020-11-24 ENCOUNTER — Other Ambulatory Visit: Payer: Self-pay

## 2020-11-24 VITALS — BP 129/84 | HR 67 | Temp 98.2°F | Ht 62.0 in | Wt 113.1 lb

## 2020-11-24 DIAGNOSIS — E785 Hyperlipidemia, unspecified: Secondary | ICD-10-CM

## 2020-11-24 DIAGNOSIS — Z8249 Family history of ischemic heart disease and other diseases of the circulatory system: Secondary | ICD-10-CM

## 2020-11-24 DIAGNOSIS — N951 Menopausal and female climacteric states: Secondary | ICD-10-CM | POA: Insufficient documentation

## 2020-11-24 DIAGNOSIS — Z0000 Encounter for general adult medical examination without abnormal findings: Secondary | ICD-10-CM | POA: Insufficient documentation

## 2020-11-24 DIAGNOSIS — Z23 Encounter for immunization: Secondary | ICD-10-CM | POA: Insufficient documentation

## 2020-11-24 NOTE — Progress Notes (Signed)
Established Patient Office Visit  Subjective:  Patient ID: Brenda Ashley, female    DOB: [birth year]  Age: 55 y.o. MRN: [id number]  CC:  Chief Complaint  Patient presents with   Follow-up    HPI Brenda Ashley presents for follow up regarding her lab results. She did have routine, fasting labs. Her lLDL and total cholesterol were mildly elevated. The  chol/hdl was good at 2.3.   Lipid Panel     Component Value Date/Time   CHOL 225 (H) 11/13/2020 0931   TRIG 39 11/13/2020 0931   HDL 98 11/13/2020 0931   CHOLHDL 2.3 11/13/2020 0931   LDLCALC 120 (H) 11/13/2020 0931   LABVLDL 7 11/13/2020 0931    The patient is concerned with rising lipid panel as her dad did have CV disease and type 2 diabetes. Of note, HDL had improved and CHOL/HDL ratio had improved from check in 2020. The patient was also concerned about elevated BUN/Creatinine ratio. All other electrolytes and renal functions were normal. Brother also had Type 2 dabetes and had recently passed away from renal failure.  Of note, the patient did have [place], LH, and estradiol levels drawn with recent labs. Labs consistent with menopause. Patient states that she sent her GYN provider lab results and GYN provider agreed that labs are consistent with menopause.  The patient has no other concerns or complaints today. She denies chest pain, chest pressure, or shortness of breath. She denies headaches or visual disturbances. She denies abdominal pain, nausea, vomiting, or changes in bowel or bladder habits.    Past Medical History:  Diagnosis Date   Complication of anesthesia    PONV (postoperative nausea and vomiting)     Past Surgical History:  Procedure Laterality Date   BREAST LUMPECTOMY WITH RADIOACTIVE SEED LOCALIZATION Right 05/30/2019   Procedure: RIGHT BREAST LUMPECTOMY WITH RADIOACTIVE SEED LOCALIZATION;  Surgeon: Erroll Luna, MD;  Location: [place];  Service: General;  Laterality: Right;   VULVA  SURGERY  2019   wide excisional biopsy   WRIST FRACTURE SURGERY  2010    Family History  Problem Relation Age of Onset   Breast cancer Maternal Aunt    Breast cancer Cousin    Lung cancer Mother    Heart attack Father    Depression Father    Hyperlipidemia Father    Diabetes Brother    Hyperlipidemia Brother     Social History   Socioeconomic History   Marital status: Married    Spouse name: Not on file   Number of children: Not on file   Years of education: Not on file   Highest education level: Not on file  Occupational History   Not on file  Tobacco Use   Smoking status: Former    Packs/day: 0.50    Years: 15.00    Pack years: 7.50    Types: Cigarettes    Quit date: 1993    Years since quitting: 29.8   Smokeless tobacco: Never  Vaping Use   Vaping Use: Never used  Substance and Sexual Activity   Alcohol use: Yes    Comment: weekends   Drug use: No   Sexual activity: Yes    Partners: Male    Birth control/protection: I.U.D.  Other Topics Concern   Not on file  Social History Narrative   Not on file   Social Determinants of Health   Financial Resource Strain: Not on file  Food Insecurity: Not on file  Transportation Needs:  Not on file  Physical Activity: Not on file  Stress: Not on file  Social Connections: Not on file  Intimate Partner Violence: Not on file    Outpatient Medications Prior to Visit  Medication Sig Dispense Refill   Cholecalciferol (VITAMIN D3) 5000 units TABS 5,000 IU OTC vitamin D3 daily. 90 tablet 3   Multiple Vitamin (MULTIVITAMIN) capsule Take 1 capsule by mouth daily.     No facility-administered medications prior to visit.    Allergies  Allergen Reactions   Codeine Nausea And Vomiting    ROS Review of Systems  Constitutional:  Negative for activity change, appetite change, chills, fatigue and fever.  HENT:  Negative for congestion, postnasal drip, rhinorrhea, sinus pressure, sinus pain, sneezing and sore throat.    Eyes: Negative.   Respiratory:  Negative for cough, chest tightness, shortness of breath and wheezing.   Cardiovascular:  Negative for chest pain and palpitations.  Gastrointestinal:  Negative for abdominal pain, constipation, diarrhea, nausea and vomiting.  Endocrine: Negative for cold intolerance, heat intolerance, polydipsia and polyuria.  Genitourinary:  Negative for dyspareunia, dysuria, flank pain, frequency and urgency.  Musculoskeletal:  Negative for arthralgias, back pain and myalgias.  Skin:  Negative for rash.  Allergic/Immunologic: Negative for environmental allergies.  Neurological:  Negative for dizziness, weakness and headaches.  Hematological:  Negative for adenopathy.  Psychiatric/Behavioral:  The patient is not nervous/anxious.      Objective:    Physical Exam Vitals and nursing note reviewed.  Constitutional:      Appearance: Normal appearance. She is well-developed.  HENT:     Head: Normocephalic and atraumatic.     Nose: Nose normal.     Mouth/Throat:     Mouth: Mucous membranes are moist.  Eyes:     Extraocular Movements: Extraocular movements intact.     Conjunctiva/sclera: Conjunctivae normal.     Pupils: Pupils are equal, round, and reactive to light.  Cardiovascular:     Rate and Rhythm: Normal rate and regular rhythm.     Pulses: Normal pulses.     Heart sounds: Normal heart sounds.  Pulmonary:     Effort: Pulmonary effort is normal.     Breath sounds: Normal breath sounds.  Abdominal:     Palpations: Abdomen is soft.  Musculoskeletal:        General: Normal range of motion.     Cervical back: Normal range of motion and neck supple.  Lymphadenopathy:     Cervical: No cervical adenopathy.  Skin:    General: Skin is warm and dry.     Capillary Refill: Capillary refill takes less than 2 seconds.  Neurological:     General: No focal deficit present.     Mental Status: She is alert and oriented to person, place, and time.  Psychiatric:         Mood and Affect: Mood normal.        Behavior: Behavior normal.        Thought Content: Thought content normal.        Judgment: Judgment normal.    Today's Vitals   11/24/20 1313  BP: 129/84  Pulse: 67  Temp: 98.2 F (36.8 C)  SpO2: 99%  Weight: 113 lb 1.6 oz (51.3 kg)  Height: 5' 2"  (1.575 m)   Body mass index is 20.69 kg/m.   Wt Readings from Last 3 Encounters:  11/24/20 113 lb 1.6 oz (51.3 kg)  11/13/20 109 lb 9.6 oz (49.7 kg)  06/24/19 130 lb (59 kg)  There are no preventive care reminders to display for this patient.  There are no preventive care reminders to display for this patient.  Lab Results  Component Value Date   TSH 1.100 11/13/2020   Lab Results  Component Value Date   WBC 4.3 11/13/2020   HGB 14.9 11/13/2020   HCT 45.1 11/13/2020   MCV 98 (H) 11/13/2020   PLT 206 11/13/2020   Lab Results  Component Value Date   NA 141 11/13/2020   K 4.2 11/13/2020   CO2 21 11/13/2020   GLUCOSE 90 11/13/2020   BUN 22 11/13/2020   CREATININE 0.73 11/13/2020   BILITOT 0.7 11/13/2020   ALKPHOS 65 11/13/2020   AST 19 11/13/2020   ALT 16 11/13/2020   PROT 7.0 11/13/2020   ALBUMIN 4.7 11/13/2020   CALCIUM 9.5 11/13/2020   ANIONGAP 8 05/27/2019   EGFR 98 11/13/2020   Lab Results  Component Value Date   CHOL 225 (H) 11/13/2020   Lab Results  Component Value Date   HDL 98 11/13/2020   Lab Results  Component Value Date   LDLCALC 120 (H) 11/13/2020   Lab Results  Component Value Date   TRIG 39 11/13/2020   Lab Results  Component Value Date   CHOLHDL 2.3 11/13/2020   Lab Results  Component Value Date   HGBA1C 5.2 12/26/2018      Assessment & Plan:  1. Hyperlipidemia LDL goal <100 Reviewed recent routine, fasting labs with mild elevation of LDL and total cholesterol.  Discussed limiting intake of fried and fatty foods and incorporating exercise into her regular routine.  Encouraged her to add fish oil 100 mg up to 300 mg daily.  Recheck  lipid panel in 6 months and treat as indicated.  2. Family history of cardiovascular disease Patient with strong family history of cardiovascular disease.  Will monitor lipid panel closely and treat more assertively if continued elevation continues.  Patient is agreeable with the plan.  Problem List Items Addressed This Visit       Other   Family history of cardiovascular disease   Hyperlipidemia LDL goal <100 - Primary     Follow-up: Return in 6 months (on 05/25/2021), or fasting lipid panel and bmp prior to visit, for As scheduled. Marland Kitchen    Ronnell Freshwater, NP  This note was dictated using Systems analyst. Rapid proofreading was performed to expedite the delivery of the information. Despite proofreading, phonetic errors will occur which are common with this voice recognition software. Please take this into consideration. If there are any concerns, please contact our office.

## 2020-11-25 NOTE — Progress Notes (Signed)
Reviewed labs with patient at recent visit .

## 2020-11-29 DIAGNOSIS — E785 Hyperlipidemia, unspecified: Secondary | ICD-10-CM | POA: Insufficient documentation

## 2021-05-08 LAB — LIPID PANEL
HDL: 91 — AB (ref 35–70)
Triglycerides: 45 (ref 40–160)

## 2021-05-08 LAB — BASIC METABOLIC PANEL
Chloride: 102 (ref 99–108)
Creatinine: 0.7 (ref <=1.1)
Glucose: 87
Potassium: 4 mEq/L (ref 3.5–5.1)
Sodium: 142 (ref 137–147)

## 2021-05-08 LAB — COMPREHENSIVE METABOLIC PANEL
Albumin: 4.3 (ref 3.5–5.0)
Calcium: 9 (ref 8.7–10.7)
Globulin: 2.2

## 2021-05-08 LAB — CBC AND DIFFERENTIAL
HCT: 42 (ref 36–46)
Platelets: 214 10*3/uL (ref 150–400)

## 2021-05-08 LAB — HEPATIC FUNCTION PANEL: Alkaline Phosphatase: 71 (ref 25–125)

## 2021-05-08 LAB — HEMOGLOBIN A1C: Hemoglobin A1C: 14.1

## 2021-05-18 ENCOUNTER — Encounter: Payer: Self-pay | Admitting: Nurse Practitioner

## 2021-06-24 ENCOUNTER — Encounter: Payer: Self-pay | Admitting: Physician Assistant

## 2021-07-21 ENCOUNTER — Other Ambulatory Visit: Payer: Self-pay | Admitting: Nurse Practitioner

## 2021-07-21 DIAGNOSIS — Z1231 Encounter for screening mammogram for malignant neoplasm of breast: Secondary | ICD-10-CM

## 2021-08-02 ENCOUNTER — Ambulatory Visit
Admission: RE | Admit: 2021-08-02 | Discharge: 2021-08-02 | Disposition: A | Discharge status: Home / Self Care | Payer: Managed Care, Other (non HMO) | Source: Ambulatory Visit

## 2021-08-02 DIAGNOSIS — Z1231 Encounter for screening mammogram for malignant neoplasm of breast: Secondary | ICD-10-CM

## 2021-08-04 NOTE — Progress Notes (Signed)
Benign appearing mammogram. Repeat in one year.

## 2022-02-21 ENCOUNTER — Encounter: Payer: Self-pay | Admitting: Nurse Practitioner

## 2022-02-21 DIAGNOSIS — E785 Hyperlipidemia, unspecified: Secondary | ICD-10-CM

## 2022-02-21 DIAGNOSIS — E559 Vitamin D deficiency, unspecified: Secondary | ICD-10-CM

## 2022-02-21 DIAGNOSIS — Z0000 Encounter for general adult medical examination without abnormal findings: Secondary | ICD-10-CM

## 2022-02-21 NOTE — Telephone Encounter (Signed)
Thank you :)

## 2022-02-23 ENCOUNTER — Other Ambulatory Visit: Payer: Managed Care, Other (non HMO)

## 2022-02-23 DIAGNOSIS — E559 Vitamin D deficiency, unspecified: Secondary | ICD-10-CM

## 2022-02-23 DIAGNOSIS — E785 Hyperlipidemia, unspecified: Secondary | ICD-10-CM

## 2022-02-23 DIAGNOSIS — Z0000 Encounter for general adult medical examination without abnormal findings: Secondary | ICD-10-CM

## 2022-02-24 LAB — CBC WITH DIFFERENTIAL/PLATELET
Basophils Absolute: 0 10*3/uL (ref 0.0–0.2)
Basos: 1 %
EOS (ABSOLUTE): 0 10*3/uL (ref 0.0–0.4)
Eos: 1 %
Hematocrit: 43.2 % (ref 34.0–46.6)
Hemoglobin: 14.6 g/dL (ref 11.1–15.9)
Immature Grans (Abs): 0 10*3/uL (ref 0.0–0.1)
Immature Granulocytes: 0 %
Lymphocytes Absolute: 2.1 10*3/uL (ref 0.7–3.1)
Lymphs: 49 %
MCH: 31.6 pg (ref 26.6–33.0)
MCHC: 33.8 g/dL (ref 31.5–35.7)
MCV: 94 fL (ref 79–97)
Monocytes Absolute: 0.4 10*3/uL (ref 0.1–0.9)
Monocytes: 10 %
Neutrophils Absolute: 1.6 10*3/uL (ref 1.4–7.0)
Neutrophils: 39 %
Platelets: 224 10*3/uL (ref 150–450)
RBC: 4.62 x10E6/uL (ref 3.77–5.28)
RDW: 11.8 % (ref 11.7–15.4)
WBC: 4.2 10*3/uL (ref 3.4–10.8)

## 2022-02-24 LAB — COMPREHENSIVE METABOLIC PANEL
ALT: 17 IU/L (ref 0–32)
AST: 19 IU/L (ref 0–40)
Albumin/Globulin Ratio: 2 (ref 1.2–2.2)
Albumin: 4.6 g/dL (ref 3.8–4.9)
Alkaline Phosphatase: 59 IU/L (ref 44–121)
BUN/Creatinine Ratio: 21 (ref 9–23)
BUN: 17 mg/dL (ref 6–24)
Bilirubin Total: 0.8 mg/dL (ref 0.0–1.2)
CO2: 23 mmol/L (ref 20–29)
Calcium: 9.5 mg/dL (ref 8.7–10.2)
Chloride: 105 mmol/L (ref 96–106)
Creatinine, Ser: 0.82 mg/dL (ref 0.57–1.00)
Globulin, Total: 2.3 g/dL (ref 1.5–4.5)
Glucose: 100 mg/dL — ABNORMAL HIGH (ref 70–99)
Potassium: 4.4 mmol/L (ref 3.5–5.2)
Sodium: 143 mmol/L (ref 134–144)
Total Protein: 6.9 g/dL (ref 6.0–8.5)
eGFR: 84 mL/min/{1.73_m2} (ref >59)

## 2022-02-24 LAB — HEMOGLOBIN A1C
Est. average glucose Bld gHb Est-mCnc: 108 mg/dL
Hgb A1c MFr Bld: 5.4 % (ref 4.8–5.6)

## 2022-02-24 LAB — TSH: TSH: 1.33 u[IU]/mL (ref 0.450–4.500)

## 2022-02-24 LAB — LIPID PANEL
Chol/HDL Ratio: 2.5 ratio (ref 0.0–4.4)
Cholesterol, Total: 225 mg/dL — ABNORMAL HIGH (ref 100–199)
HDL: 90 mg/dL (ref >39)
LDL Chol Calc (NIH): 121 mg/dL — ABNORMAL HIGH (ref 0–99)
Triglycerides: 82 mg/dL (ref 0–149)
VLDL Cholesterol Cal: 14 mg/dL (ref 5–40)

## 2022-02-24 LAB — VITAMIN D 25 HYDROXY (VIT D DEFICIENCY, FRACTURES): Vit D, 25-Hydroxy: 36.3 ng/mL (ref 30.0–100.0)

## 2022-02-24 NOTE — Progress Notes (Signed)
Mild elevation of LDL and total cholesterol. Other labs good. Discuss with patient at visit 03/04/2022

## 2022-03-04 ENCOUNTER — Encounter: Payer: Self-pay | Admitting: Nurse Practitioner

## 2022-03-04 ENCOUNTER — Ambulatory Visit (INDEPENDENT_AMBULATORY_CARE_PROVIDER_SITE_OTHER): Payer: Self-pay | Admitting: Nurse Practitioner

## 2022-03-04 VITALS — BP 137/83 | HR 72 | Ht 62.0 in | Wt 119.8 lb

## 2022-03-04 DIAGNOSIS — Z23 Encounter for immunization: Secondary | ICD-10-CM

## 2022-03-04 DIAGNOSIS — R109 Unspecified abdominal pain: Secondary | ICD-10-CM

## 2022-03-04 DIAGNOSIS — Z79890 Hormone replacement therapy (postmenopausal): Secondary | ICD-10-CM

## 2022-03-04 DIAGNOSIS — Z0001 Encounter for general adult medical examination with abnormal findings: Secondary | ICD-10-CM

## 2022-03-04 LAB — POCT URINALYSIS DIP (CLINITEK)
Bilirubin, UA: NEGATIVE
Blood, UA: NEGATIVE
Glucose, UA: NEGATIVE mg/dL
Ketones, POC UA: NEGATIVE mg/dL
Nitrite, UA: NEGATIVE
POC PROTEIN,UA: NEGATIVE
Spec Grav, UA: 1.02 (ref 1.010–1.025)
Urobilinogen, UA: 0.2 E.U./dL
pH, UA: 8.5 — AB (ref 5.0–8.0)

## 2022-03-04 MED ORDER — PROGESTERONE MICRONIZED 100 MG PO CAPS: ORAL_CAPSULE | ORAL | 5 refills | Status: DC

## 2022-03-04 MED ORDER — ESTRADIOL 0.5 MG PO TABS: 0.5000 mg | ORAL_TABLET | Freq: Every day | ORAL | 5 refills | Status: DC

## 2022-03-04 NOTE — Progress Notes (Signed)
Will send for culture and sensitivity and treat as indicated

## 2022-03-04 NOTE — Progress Notes (Signed)
Complete physical exam   Patient: Brenda Ashley   DOB: 1965-05-11   57 y.o. Female  MRN: [id number] Visit Date: 03/04/2022    Chief Complaint  Patient presents with   Annual Exam   Subjective    Brenda Ashley is a 57 y.o. female who presents today for a complete physical exam.  She reports consuming a  generally healthy  diet. Gym/ health club routine includes weight lifting at least 3 times per day.. She generally feels well. She does have additional problems to discuss today.   HPI  Annual physical  -right flank pain -right lower belly pain  -going on for past few months.   Past Medical History:  Diagnosis Date   Complication of anesthesia    PONV (postoperative nausea and vomiting)    Past Surgical History:  Procedure Laterality Date   BREAST LUMPECTOMY WITH RADIOACTIVE SEED LOCALIZATION Right 05/30/2019   Procedure: RIGHT BREAST LUMPECTOMY WITH RADIOACTIVE SEED LOCALIZATION;  Surgeon: Erroll Luna, MD;  Location: [place];  Service: General;  Laterality: Right;   VULVA SURGERY  2019   wide excisional biopsy   WRIST FRACTURE SURGERY  2010   Social History   Socioeconomic History   Marital status: Married    Spouse name: Not on file   Number of children: Not on file   Years of education: Not on file   Highest education level: Not on file  Occupational History   Not on file  Tobacco Use   Smoking status: Former    Packs/day: 0.50    Years: 15.00    Total pack years: 7.50    Types: Cigarettes    Quit date: 36    Years since quitting: 31.1   Smokeless tobacco: Never  Vaping Use   Vaping Use: Never used  Substance and Sexual Activity   Alcohol use: Yes    Comment: weekends   Drug use: No   Sexual activity: Yes    Partners: Male    Birth control/protection: I.U.D.  Other Topics Concern   Not on file  Social History Narrative   Not on file   Social Determinants of Health   Financial Resource Strain: Not on file  Food  Insecurity: Not on file  Transportation Needs: Not on file  Physical Activity: Not on file  Stress: Not on file  Social Connections: Not on file  Intimate Partner Violence: Not on file   Family Status  Relation Name Status   Mat Aunt  (Not Specified)   Cousin  (Not Specified)   Mother  (Not Specified)   Father  (Not Specified)   Brother  (Not Specified)   Family History  Problem Relation Age of Onset   Breast cancer Maternal Aunt    Breast cancer Cousin    Lung cancer Mother    Heart attack Father    Depression Father    Hyperlipidemia Father    Diabetes Brother    Hyperlipidemia Brother    Allergies  Allergen Reactions   Codeine Nausea And Vomiting    Patient Care Team: Lorrene Reid, PA-C (Inactive) as PCP - Anson Fret, MD as Referring Physician (Dermatology) Hansen-Lindner, Terance Hart, MD (Obstetrics and Gynecology)   Medications: Outpatient Medications Prior to Visit  Medication Sig   Cholecalciferol (VITAMIN D3) 5000 units TABS 5,000 IU OTC vitamin D3 daily.   Multiple Vitamin (MULTIVITAMIN) capsule Take 1 capsule by mouth daily.   [DISCONTINUED] estradiol (ESTRACE) 0.5 MG tablet Take by mouth.  No facility-administered medications prior to visit.    Review of Systems  Constitutional:  Positive for fatigue. Negative for activity change, appetite change, chills and fever.  HENT:  Negative for congestion, postnasal drip, rhinorrhea, sinus pressure, sinus pain, sneezing and sore throat.   Eyes: Negative.   Respiratory:  Negative for cough, chest tightness, shortness of breath and wheezing.   Cardiovascular:  Negative for chest pain and palpitations.  Gastrointestinal:  Positive for abdominal pain. Negative for diarrhea, nausea and vomiting.  Endocrine: Negative for cold intolerance, heat intolerance, polydipsia and polyuria.  Genitourinary:  Positive for dysuria, flank pain and pelvic pain. Negative for dyspareunia, frequency and urgency.   Musculoskeletal:  Negative for arthralgias, back pain and myalgias.  Skin:  Negative for rash.  Allergic/Immunologic: Negative for environmental allergies.  Neurological:  Negative for dizziness, weakness and headaches.  Hematological:  Negative for adenopathy.  Psychiatric/Behavioral:  The patient is not nervous/anxious.     Last CBC Lab Results  Component Value Date   WBC 4.2 02/23/2022   HGB 14.6 02/23/2022   HCT 43.2 02/23/2022   MCV 94 02/23/2022   MCH 31.6 02/23/2022   RDW 11.8 02/23/2022   PLT 224 [id number]   Last metabolic panel Lab Results  Component Value Date   GLUCOSE 100 (H) 02/23/2022   NA 143 02/23/2022   K 4.4 02/23/2022   CL 105 02/23/2022   CO2 23 02/23/2022   BUN 17 02/23/2022   CREATININE 0.82 02/23/2022   EGFR 84 02/23/2022   CALCIUM 9.5 02/23/2022   PROT 6.9 02/23/2022   ALBUMIN 4.6 02/23/2022   LABGLOB 2.3 02/23/2022   AGRATIO 2.0 02/23/2022   BILITOT 0.8 02/23/2022   ALKPHOS 59 02/23/2022   AST 19 02/23/2022   ALT 17 02/23/2022   ANIONGAP 8 05/27/2019   Last lipids Lab Results  Component Value Date   CHOL 225 (H) 02/23/2022   HDL 90 02/23/2022   LDLCALC 121 (H) 02/23/2022   TRIG 82 02/23/2022   CHOLHDL 2.5 02/23/2022   Last hemoglobin A1c Lab Results  Component Value Date   HGBA1C 5.4 02/23/2022   Last thyroid functions Lab Results  Component Value Date   TSH 1.330 02/23/2022   T3TOTAL 111 12/26/2018   Last vitamin D Lab Results  Component Value Date   VD25OH 36.3 02/23/2022        Objective     Today's Vitals   03/04/22 0933  BP: 137/83  Pulse: 72  SpO2: 100%  Weight: 119 lb 12.8 oz (54.3 kg)  Height: 5' 2"$  (1.575 m)   Body mass index is 21.91 kg/m.  BP Readings from Last 3 Encounters:  03/04/22 137/83  11/24/20 129/84  11/13/20 104/71    Wt Readings from Last 3 Encounters:  03/04/22 119 lb 12.8 oz (54.3 kg)  11/24/20 113 lb 1.6 oz (51.3 kg)  11/13/20 109 lb 9.6 oz (49.7 kg)     Physical  Exam Vitals and nursing note reviewed.  Constitutional:      Appearance: Normal appearance. She is well-developed.  HENT:     Head: Normocephalic and atraumatic.     Right Ear: Tympanic membrane, ear canal and external ear normal.     Left Ear: Tympanic membrane, ear canal and external ear normal.     Nose: Nose normal.     Mouth/Throat:     Mouth: Mucous membranes are moist.     Pharynx: Oropharynx is clear.  Eyes:     Extraocular Movements: Extraocular movements intact.  Conjunctiva/sclera: Conjunctivae normal.     Pupils: Pupils are equal, round, and reactive to light.  Cardiovascular:     Rate and Rhythm: Normal rate and regular rhythm.     Pulses: Normal pulses.     Heart sounds: Normal heart sounds.  Pulmonary:     Effort: Pulmonary effort is normal.     Breath sounds: Normal breath sounds.  Abdominal:     General: Bowel sounds are normal. There is no distension.     Palpations: Abdomen is soft. There is no mass.     Tenderness: There is abdominal tenderness. There is no right CVA tenderness, left CVA tenderness, guarding or rebound.     Hernia: No hernia is present.  Genitourinary:    Comments: Urine sample positive for trace WBC  Musculoskeletal:        General: Normal range of motion.     Cervical back: Normal range of motion and neck supple.  Lymphadenopathy:     Cervical: No cervical adenopathy.  Skin:    General: Skin is warm and dry.     Capillary Refill: Capillary refill takes less than 2 seconds.  Neurological:     General: No focal deficit present.     Mental Status: She is alert and oriented to person, place, and time.  Psychiatric:        Mood and Affect: Mood normal.        Behavior: Behavior normal.        Thought Content: Thought content normal.        Judgment: Judgment normal.     Last depression screening scores   Row Labels 03/04/2022    9:40 AM 11/24/2020    1:14 PM 11/13/2020    8:51 AM  PHQ 2/9 Scores   Section Header. No data exists  in this row.     PHQ - 2 Score   0 0 0  PHQ- 9 Score   1 4 2   $ Last fall risk screening   Row Labels 03/04/2022    9:40 AM  Fall Risk    Section Header. No data exists in this row.   Falls in the past year?   0  Number falls in past yr:   0  Injury with Fall?   0  Follow up   Falls evaluation completed    Results for orders placed or performed in visit on 03/04/22  Urine Culture   Specimen: Urine   UR  Result Value Ref Range   Urine Culture, Routine Final report    Organism ID, Bacteria Comment   POCT URINALYSIS DIP (CLINITEK)  Result Value Ref Range   Color, UA yellow yellow   Clarity, UA clear clear   Glucose, UA negative negative mg/dL   Bilirubin, UA negative negative   Ketones, POC UA negative negative mg/dL   Spec Grav, UA 1.020 1.010 - 1.025   Blood, UA negative negative   pH, UA 8.5 (A) 5.0 - 8.0   POC PROTEIN,UA negative negative, trace   Urobilinogen, UA 0.2 0.2 or 1.0 E.U./dL   Nitrite, UA Negative Negative   Leukocytes, UA Trace (A) Negative    Assessment & Plan    1. Encounter for general adult medical examination with abnormal findings Annual physical today   2. Flank pain Urine sample positive for trace WBC only. Send for culture and sensitivity and treat as indicated.  - Urine Culture; Future - POCT URINALYSIS DIP (CLINITEK) - Urine Culture  3. Hormone replacement therapy  Continue estradiol and progesterone as previously prescribed. Refills provided today  - progesterone (PROMETRIUM) 100 MG capsule; Take 1 capsule po daily for 2 weeks every 4 weeks  Dispense: 14 capsule; Refill: 5 - estradiol (ESTRACE) 0.5 MG tablet; Take 1 tablet (0.5 mg total) by mouth daily.  Dispense: 30 tablet; Refill: 5  4. Need for influenza vaccination Flu vaccine administered during today's visit.  - Flu Vaccine QUAD 6+ mos PF IM (Fluarix Quad PF)    Immunization History  Administered Date(s) Administered   Influenza,inj,Quad PF,6+ Mos 11/19/2018, 11/13/2020,  03/04/2022   Moderna Sars-Covid-2 Vaccination 04/22/2019, 05/24/2019, 02/13/2020   Tdap 08/03/2011, 11/19/2018   Zoster Recombinat (Shingrix) 01/07/2019, 06/24/2019    Health Maintenance  Topic Date Due   Hepatitis C Screening  Never done   COVID-19 Vaccine (4 - 2023-24 season) 10/15/2021   Fecal DNA (Cologuard)  05/05/2022   PAP SMEAR-Modifier  05/12/2023   MAMMOGRAM  08/03/2023   DTaP/Tdap/Td (3 - Td or Tdap) 11/18/2028   INFLUENZA VACCINE  Completed   HIV Screening  Completed   Zoster Vaccines- Shingrix  Completed   HPV VACCINES  Aged Out   COLONOSCOPY (Pts 45-42yr Insurance coverage will need to be confirmed)  Discontinued    Discussed health benefits of physical activity, and encouraged her to engage in regular exercise appropriate for her age and condition.  Problem List Items Addressed This Visit       Other   Need for influenza vaccination   Relevant Orders   Flu Vaccine QUAD 6+ mos PF IM (Fluarix Quad PF) (Completed)   Flank pain   Relevant Orders   Urine Culture (Completed)   POCT URINALYSIS DIP (CLINITEK) (Completed)   Hormone replacement therapy   Relevant Medications   progesterone (PROMETRIUM) 100 MG capsule   estradiol (ESTRACE) 0.5 MG tablet   Other Visit Diagnoses     Encounter for general adult medical examination with abnormal findings    -  Primary        Return in about 6 months (around 09/02/2022) for med refills.        HRonnell Freshwater NP  [phone](phone) [phone](fax)  [place]

## 2022-03-06 ENCOUNTER — Other Ambulatory Visit: Payer: Self-pay | Admitting: Nurse Practitioner

## 2022-03-06 DIAGNOSIS — N390 Urinary tract infection, site not specified: Secondary | ICD-10-CM

## 2022-03-06 LAB — URINE CULTURE

## 2022-03-06 MED ORDER — AMOXICILLIN-POT CLAVULANATE 875-125 MG PO TABS: 1.0000 | ORAL_TABLET | Freq: Two times a day (BID) | ORAL | 0 refills | Status: DC

## 2022-03-06 NOTE — Progress Notes (Signed)
Please let the patient know that urine culture showed very mild infection due to overgrowth of normal bacgteria. I have sent prescription for augmentin to  her pharmacy. This should be taken twice daily for next 5 days. She should drink plenty  of water and take the medication with food.  Thanks so much.   -HB

## 2022-04-07 DIAGNOSIS — R109 Unspecified abdominal pain: Secondary | ICD-10-CM | POA: Insufficient documentation

## 2022-04-07 DIAGNOSIS — Z79890 Hormone replacement therapy (postmenopausal): Secondary | ICD-10-CM | POA: Insufficient documentation

## 2022-05-19 DIAGNOSIS — N9089 Other specified noninflammatory disorders of vulva and perineum: Secondary | ICD-10-CM | POA: Diagnosis not present

## 2022-05-19 DIAGNOSIS — Z79890 Hormone replacement therapy (postmenopausal): Secondary | ICD-10-CM | POA: Diagnosis not present

## 2022-05-19 DIAGNOSIS — Z01419 Encounter for gynecological examination (general) (routine) without abnormal findings: Secondary | ICD-10-CM | POA: Diagnosis not present

## 2022-05-19 DIAGNOSIS — F5101 Primary insomnia: Secondary | ICD-10-CM | POA: Diagnosis not present

## 2022-07-21 ENCOUNTER — Ambulatory Visit: Payer: Managed Care, Other (non HMO) | Admitting: Family Medicine

## 2022-07-27 ENCOUNTER — Ambulatory Visit (INDEPENDENT_AMBULATORY_CARE_PROVIDER_SITE_OTHER): Payer: BC Managed Care – PPO | Admitting: Family Medicine

## 2022-07-27 ENCOUNTER — Encounter: Payer: Self-pay | Admitting: Family Medicine

## 2022-07-27 VITALS — BP 127/81 | HR 75 | Resp 18 | Ht 62.0 in | Wt 119.0 lb

## 2022-07-27 DIAGNOSIS — R82998 Other abnormal findings in urine: Secondary | ICD-10-CM

## 2022-07-27 DIAGNOSIS — R109 Unspecified abdominal pain: Secondary | ICD-10-CM | POA: Diagnosis not present

## 2022-07-27 LAB — POCT URINALYSIS DIPSTICK
Bilirubin, UA: NEGATIVE
Blood, UA: NEGATIVE
Glucose, UA: NEGATIVE
Ketones, UA: NEGATIVE
Nitrite, UA: NEGATIVE
Protein, UA: NEGATIVE
Spec Grav, UA: 1.025 (ref 1.010–1.025)
Urobilinogen, UA: 1 E.U./dL
pH, UA: 7 (ref 5.0–8.0)

## 2022-07-27 MED ORDER — LEVOFLOXACIN 750 MG PO TABS
750.0000 mg | ORAL_TABLET | Freq: Every day | ORAL | 0 refills | Status: DC
Start: 2022-07-27 — End: 2022-10-20

## 2022-07-27 NOTE — Progress Notes (Signed)
Acute Office Visit  Subjective:     Patient ID: Brenda Ashley, female    DOB: Feb 15, 1965, 57 y.o.   MRN: [id number]  Chief Complaint  Patient presents with   Flank Pain    Right side    HPI Patient is in today for possible kidney infection.  She is currently experiencing 3/10 dull right flank pain which started within the past week or so.  A similar episode happened 5 months ago: She had right flank pain, right lower abdominal pain for several months.  Urinalysis showed trace leukocytes, completed course of Augmentin.  Urine culture revealed only mixed urogenital flora <10,000 colonies/mL.  She denies dysuria, change in urine color or odor, change in urinary urgency or frequency, fever, chills, fatigue, nausea/vomiting.  Symptoms are isolated to her right side and do not radiate.  She endorses drinking 50-60 ounces of fluid each day, though she does have a very high protein diet.  ROS Negative unless otherwise noted in HPI    Objective:    BP 127/81 (BP Location: Left Arm, Patient Position: Sitting, Cuff Size: Normal)   Pulse 75   Resp 18   Ht 5\' 2"  (1.575 m)   Wt 119 lb (54 kg)   SpO2 98%   BMI 21.77 kg/m   Physical Exam Constitutional:      General: She is not in acute distress.    Appearance: Normal appearance.  HENT:     Head: Normocephalic and atraumatic.  Cardiovascular:     Rate and Rhythm: Normal rate and regular rhythm.     Heart sounds: No murmur heard.    No friction rub. No gallop.  Pulmonary:     Effort: Pulmonary effort is normal. No respiratory distress.     Breath sounds: No wheezing, rhonchi or rales.  Abdominal:     General: Abdomen is flat. Bowel sounds are normal. There is no distension.     Palpations: Abdomen is soft. There is no mass.     Tenderness: There is abdominal tenderness (Right flank). There is no guarding.  Skin:    General: Skin is warm and dry.  Neurological:     Mental Status: She is alert and oriented to person, place, and  time.    Results for orders placed or performed in visit on 07/27/22  POCT Urinalysis Dipstick  Result Value Ref Range   Color, UA Yellow    Clarity, UA Clear    Glucose, UA Negative Negative   Bilirubin, UA Negative    Ketones, UA Negative    Spec Grav, UA 1.025 1.010 - 1.025   Blood, UA Negative    pH, UA 7.0 5.0 - 8.0   Protein, UA Negative Negative   Urobilinogen, UA 1.0 0.2 or 1.0 E.U./dL   Nitrite, UA Negative    Leukocytes, UA Trace (A) Negative   Appearance     Odor        Assessment & Plan:  Flank pain -     US RENAL; Future -     levoFLOXacin; Take 1 tablet (750 mg total) by mouth daily.  Dispense: 7 tablet; Refill: 0 -     POCT urinalysis dipstick  Urine leukocytes -     levoFLOXacin; Take 1 tablet (750 mg total) by mouth daily.  Dispense: 7 tablet; Refill: 0 -     Urine Culture  Starting workup with repeat UA which revealed trace leukocytes.  Also recommend renal ultrasound to evaluate for any signs of nephrolithiasis, hydronephrosis,  or other renal pathology given that her first episode of flank pain started several months ago and she does not have any other signs/symptoms of infection.  Return if symptoms worsen or fail to improve.  Melida Quitter, PA

## 2022-07-29 ENCOUNTER — Other Ambulatory Visit: Payer: Self-pay | Admitting: Nurse Practitioner

## 2022-07-29 DIAGNOSIS — Z1231 Encounter for screening mammogram for malignant neoplasm of breast: Secondary | ICD-10-CM

## 2022-07-29 LAB — URINE CULTURE

## 2022-08-01 ENCOUNTER — Ambulatory Visit
Admission: RE | Admit: 2022-08-01 | Discharge: 2022-08-01 | Disposition: A | Discharge status: Home / Self Care | Payer: BC Managed Care – PPO | Source: Ambulatory Visit | Attending: Family Medicine | Admitting: Family Medicine

## 2022-08-01 DIAGNOSIS — R109 Unspecified abdominal pain: Secondary | ICD-10-CM

## 2022-08-01 DIAGNOSIS — R1031 Right lower quadrant pain: Secondary | ICD-10-CM | POA: Diagnosis not present

## 2022-08-11 ENCOUNTER — Ambulatory Visit
Admission: RE | Admit: 2022-08-11 | Discharge: 2022-08-11 | Disposition: A | Discharge status: Home / Self Care | Payer: BC Managed Care – PPO | Source: Ambulatory Visit

## 2022-08-11 DIAGNOSIS — Z1231 Encounter for screening mammogram for malignant neoplasm of breast: Secondary | ICD-10-CM

## 2022-09-21 DIAGNOSIS — Z85820 Personal history of malignant melanoma of skin: Secondary | ICD-10-CM | POA: Diagnosis not present

## 2022-09-21 DIAGNOSIS — L821 Other seborrheic keratosis: Secondary | ICD-10-CM | POA: Diagnosis not present

## 2022-09-21 DIAGNOSIS — Z85828 Personal history of other malignant neoplasm of skin: Secondary | ICD-10-CM | POA: Diagnosis not present

## 2022-09-21 DIAGNOSIS — D225 Melanocytic nevi of trunk: Secondary | ICD-10-CM | POA: Diagnosis not present

## 2022-10-19 ENCOUNTER — Encounter: Payer: Self-pay | Admitting: Family Medicine

## 2022-10-19 DIAGNOSIS — R070 Pain in throat: Secondary | ICD-10-CM

## 2022-10-19 DIAGNOSIS — Z87891 Personal history of nicotine dependence: Secondary | ICD-10-CM

## 2022-10-19 DIAGNOSIS — R079 Chest pain, unspecified: Secondary | ICD-10-CM

## 2022-10-19 DIAGNOSIS — R109 Unspecified abdominal pain: Secondary | ICD-10-CM

## 2022-10-20 ENCOUNTER — Encounter: Payer: Self-pay | Admitting: Family Medicine

## 2022-10-20 ENCOUNTER — Ambulatory Visit (INDEPENDENT_AMBULATORY_CARE_PROVIDER_SITE_OTHER): Payer: BC Managed Care – PPO | Admitting: Family Medicine

## 2022-10-20 VITALS — BP 126/85 | HR 78 | Resp 18 | Ht 62.0 in | Wt 117.0 lb

## 2022-10-20 DIAGNOSIS — Z23 Encounter for immunization: Secondary | ICD-10-CM

## 2022-10-20 DIAGNOSIS — Z1212 Encounter for screening for malignant neoplasm of rectum: Secondary | ICD-10-CM | POA: Diagnosis not present

## 2022-10-20 DIAGNOSIS — Z1211 Encounter for screening for malignant neoplasm of colon: Secondary | ICD-10-CM

## 2022-10-20 DIAGNOSIS — R109 Unspecified abdominal pain: Secondary | ICD-10-CM

## 2022-10-20 NOTE — Patient Instructions (Addendum)
Once we get the lab results back, I will be in touch to figure out next steps!  I also ordered a cologuard for you since it is due and they should be mailing it to your house :)

## 2022-10-20 NOTE — Progress Notes (Signed)
   Acute Office Visit  Subjective:     Patient ID: Brenda Ashley, female    DOB: 1965/07/07, 57 y.o.   MRN: [id number]  Chief Complaint  Patient presents with   Back Pain    Lower right side    HPI Patient is in today for ongoing right abdominal/back discomfort.  She describes it as more of a pressure than a pain, it feels as if someone is squeezing her back on the right side.  Completed course of levofloxacin as prescribed at last appointment with no improvement.  As before, she denies dysuria, change in urine color or odor, change in urinary urgency or frequency, fever, chills, fatigue, nausea/vomiting.  Symptoms are isolated to her right side and do not radiate.  She denies diarrhea, constipation, blood in her stool.  ROS Negative unless otherwise noted in HPI    Objective:    BP 126/85 (BP Location: Left Arm, Patient Position: Sitting, Cuff Size: Normal)   Pulse 78   Resp 18   Ht 5\' 2"  (1.575 m)   Wt 117 lb (53.1 kg)   SpO2 99%   BMI 21.40 kg/m   Physical Exam Constitutional:      General: She is not in acute distress.    Appearance: Normal appearance.  HENT:     Head: Normocephalic and atraumatic.  Cardiovascular:     Rate and Rhythm: Regular rhythm.     Heart sounds: No murmur heard.    No friction rub. No gallop.  Pulmonary:     Effort: Pulmonary effort is normal. No respiratory distress.     Breath sounds: No wheezing, rhonchi or rales.  Abdominal:     General: Abdomen is flat. Bowel sounds are normal. There is no distension.     Palpations: Abdomen is soft. There is no mass.     Tenderness: There is abdominal tenderness in the right upper quadrant and epigastric area. There is no right CVA tenderness or rebound. Negative signs include Murphy's sign and McBurney's sign.  Skin:    General: Skin is warm and dry.  Neurological:     Mental Status: She is alert and oriented to person, place, and time.      Assessment & Plan:  Flank pain -     CBC with  Differential/Platelet; Future -     Comprehensive metabolic panel; Future -     Hepatitis C antibody; Future  Right sided abdominal pain -     CBC with Differential/Platelet; Future -     Comprehensive metabolic panel; Future -     Hepatitis C antibody; Future -     Lipase; Future  Screening for colorectal cancer -     Cologuard  Need for influenza vaccination -     Flu vaccine trivalent PF, 6mos and older(Flulaval,Afluria,Fluarix,Fluzone)  After failure of antibiotics to treat possible UTI, continuing workup with CBC, CMP, hepatitis C, lipase.  Depending on results, may consider abdominal CT and/or referral to gastroenterology or nephrology.  Return if symptoms worsen or fail to improve.  Melida Quitter, PA

## 2022-10-21 DIAGNOSIS — R109 Unspecified abdominal pain: Secondary | ICD-10-CM | POA: Diagnosis not present

## 2022-10-22 LAB — LIPASE: Lipase: 39 U/L (ref 14–72)

## 2022-10-24 ENCOUNTER — Other Ambulatory Visit: Payer: Self-pay | Admitting: Family Medicine

## 2022-10-24 DIAGNOSIS — R109 Unspecified abdominal pain: Secondary | ICD-10-CM

## 2022-10-24 DIAGNOSIS — R070 Pain in throat: Secondary | ICD-10-CM

## 2022-10-26 DIAGNOSIS — R070 Pain in throat: Secondary | ICD-10-CM | POA: Diagnosis not present

## 2022-10-26 DIAGNOSIS — R109 Unspecified abdominal pain: Secondary | ICD-10-CM | POA: Diagnosis not present

## 2022-10-26 NOTE — Addendum Note (Signed)
Addended by: Saralyn Pilar on: 10/26/2022 11:53 AM   Modules accepted: Orders

## 2022-10-27 ENCOUNTER — Other Ambulatory Visit: Payer: Self-pay | Admitting: Family Medicine

## 2022-10-27 DIAGNOSIS — R109 Unspecified abdominal pain: Secondary | ICD-10-CM

## 2022-10-27 LAB — CBC WITH DIFFERENTIAL/PLATELET
Basophils Absolute: 0 10*3/uL (ref 0.0–0.2)
Basos: 1 %
EOS (ABSOLUTE): 0 10*3/uL (ref 0.0–0.4)
Eos: 0 %
Hematocrit: 42.2 % (ref 34.0–46.6)
Hemoglobin: 13.9 g/dL (ref 11.1–15.9)
Immature Grans (Abs): 0 10*3/uL (ref 0.0–0.1)
Immature Granulocytes: 0 %
Lymphocytes Absolute: 2.1 10*3/uL (ref 0.7–3.1)
Lymphs: 35 %
MCH: 32.2 pg (ref 26.6–33.0)
MCHC: 32.9 g/dL (ref 31.5–35.7)
MCV: 98 fL — ABNORMAL HIGH (ref 79–97)
Monocytes Absolute: 0.6 10*3/uL (ref 0.1–0.9)
Monocytes: 9 %
Neutrophils Absolute: 3.3 10*3/uL (ref 1.4–7.0)
Neutrophils: 55 %
Platelets: 228 10*3/uL (ref 150–450)
RBC: 4.32 x10E6/uL (ref 3.77–5.28)
RDW: 11.7 % (ref 11.7–15.4)
WBC: 6 10*3/uL (ref 3.4–10.8)

## 2022-10-27 LAB — COMPREHENSIVE METABOLIC PANEL
ALT: 16 IU/L (ref 0–32)
AST: 16 IU/L (ref 0–40)
Albumin: 4.2 g/dL (ref 3.8–4.9)
Alkaline Phosphatase: 58 IU/L (ref 44–121)
BUN/Creatinine Ratio: 22 (ref 9–23)
BUN: 19 mg/dL (ref 6–24)
Bilirubin Total: 0.5 mg/dL (ref 0.0–1.2)
CO2: 26 mmol/L (ref 20–29)
Calcium: 9.3 mg/dL (ref 8.7–10.2)
Chloride: 103 mmol/L (ref 96–106)
Creatinine, Ser: 0.85 mg/dL (ref 0.57–1.00)
Globulin, Total: 2.5 g/dL (ref 1.5–4.5)
Glucose: 117 mg/dL — ABNORMAL HIGH (ref 70–99)
Potassium: 4.3 mmol/L (ref 3.5–5.2)
Sodium: 142 mmol/L (ref 134–144)
Total Protein: 6.7 g/dL (ref 6.0–8.5)
eGFR: 80 mL/min/{1.73_m2} (ref >59)

## 2022-10-27 LAB — TSH RFX ON ABNORMAL TO FREE T4: TSH: 1.32 u[IU]/mL (ref 0.450–4.500)

## 2022-10-27 LAB — HEPATITIS C ANTIBODY: Hep C Virus Ab: NONREACTIVE

## 2022-10-27 NOTE — Addendum Note (Signed)
Addended by: Saralyn Pilar on: 10/27/2022 02:28 PM   Modules accepted: Orders

## 2022-10-28 ENCOUNTER — Ambulatory Visit: Payer: BC Managed Care – PPO | Admitting: Family Medicine

## 2022-11-01 ENCOUNTER — Ambulatory Visit
Admission: RE | Admit: 2022-11-01 | Discharge: 2022-11-01 | Disposition: A | Discharge status: Home / Self Care | Payer: BC Managed Care – PPO | Source: Ambulatory Visit | Attending: Family Medicine | Admitting: Family Medicine

## 2022-11-01 DIAGNOSIS — R109 Unspecified abdominal pain: Secondary | ICD-10-CM

## 2022-11-01 DIAGNOSIS — M549 Dorsalgia, unspecified: Secondary | ICD-10-CM | POA: Diagnosis not present

## 2022-11-01 DIAGNOSIS — R079 Chest pain, unspecified: Secondary | ICD-10-CM

## 2022-11-01 DIAGNOSIS — Z87891 Personal history of nicotine dependence: Secondary | ICD-10-CM | POA: Diagnosis not present

## 2022-11-08 DIAGNOSIS — Z1212 Encounter for screening for malignant neoplasm of rectum: Secondary | ICD-10-CM | POA: Diagnosis not present

## 2022-11-08 DIAGNOSIS — Z1211 Encounter for screening for malignant neoplasm of colon: Secondary | ICD-10-CM | POA: Diagnosis not present

## 2022-11-10 DIAGNOSIS — R09A2 Foreign body sensation, throat: Secondary | ICD-10-CM | POA: Diagnosis not present

## 2022-11-10 DIAGNOSIS — R070 Pain in throat: Secondary | ICD-10-CM | POA: Diagnosis not present

## 2022-11-10 DIAGNOSIS — J309 Allergic rhinitis, unspecified: Secondary | ICD-10-CM | POA: Diagnosis not present

## 2022-11-12 LAB — COLOGUARD: COLOGUARD: NEGATIVE

## 2022-11-14 ENCOUNTER — Encounter: Payer: Self-pay | Admitting: Family Medicine

## 2022-11-18 DIAGNOSIS — Z79890 Hormone replacement therapy (postmenopausal): Secondary | ICD-10-CM | POA: Diagnosis not present

## 2022-11-18 DIAGNOSIS — Z01419 Encounter for gynecological examination (general) (routine) without abnormal findings: Secondary | ICD-10-CM | POA: Diagnosis not present

## 2022-11-18 DIAGNOSIS — D071 Carcinoma in situ of vulva: Secondary | ICD-10-CM | POA: Diagnosis not present

## 2022-11-18 DIAGNOSIS — N9089 Other specified noninflammatory disorders of vulva and perineum: Secondary | ICD-10-CM | POA: Diagnosis not present

## 2022-11-20 NOTE — Progress Notes (Unsigned)
New Patient Note  RE: Brenda Ashley MRN: [id number] DOB: 15-Sep-1965 Date of Office Visit: 11/21/2022  Consult requested by: Melida Quitter, PA Primary care provider: Melida Quitter, PA  Chief Complaint: No chief complaint on file.  History of Present Illness: I had the pleasure of seeing Brenda Ashley for initial evaluation at the [facility] on 11/20/2022. She is a 57 y.o. female, who is referred here by Melida Quitter, PA for the evaluation of allergic rhinitis.  Discussed the use of AI scribe software for clinical note transcription with the patient, who gave verbal consent to proceed.  History of Present Illness             She reports symptoms of ***. Symptoms have been going on for *** years. The symptoms are present *** all year around with worsening in ***. Other triggers include exposure to ***. Anosmia: ***. Headache: ***. She has used *** with ***fair improvement in symptoms. Sinus infections: ***. Previous work up includes: ***. Previous ENT evaluation: ***. Previous sinus imaging: ***. History of nasal polyps: ***. Last eye exam: ***. History of reflux: ***.  Assessment and Plan: [name] is a 57 y.o. female with: ***  Assessment and Plan               No follow-ups on file.  No orders of the defined types were placed in this encounter.  Lab Orders  No laboratory test(s) ordered today    Other allergy screening: Asthma: {Blank single:19197::"yes","no"} Rhino conjunctivitis: {Blank single:19197::"yes","no"} Food allergy: {Blank single:19197::"yes","no"} Medication allergy: {Blank single:19197::"yes","no"} Hymenoptera allergy: {Blank single:19197::"yes","no"} Urticaria: {Blank single:19197::"yes","no"} Eczema:{Blank single:19197::"yes","no"} History of recurrent infections suggestive of immunodeficency: {Blank single:19197::"yes","no"}  Diagnostics: Spirometry:  Tracings reviewed. Her effort: {Blank  single:19197::"Good reproducible efforts.","It was hard to get consistent efforts and there is a question as to whether this reflects a maximal maneuver.","Poor effort, data can not be interpreted."} FVC: ***L FEV1: ***L, ***% predicted FEV1/FVC ratio: ***% Interpretation: {Blank single:19197::"Spirometry consistent with mild obstructive disease","Spirometry consistent with moderate obstructive disease","Spirometry consistent with severe obstructive disease","Spirometry consistent with possible restrictive disease","Spirometry consistent with mixed obstructive and restrictive disease","Spirometry uninterpretable due to technique","Spirometry consistent with normal pattern","No overt abnormalities noted given today's efforts"}.  Please see scanned spirometry results for details.  Skin Testing: {Blank single:19197::"Select foods","Environmental allergy panel","Environmental allergy panel and select foods","Food allergy panel","None","Deferred due to recent antihistamines use"}. *** Results discussed with patient/family.   Past Medical History: Patient Active Problem List   Diagnosis Date Noted  . Flank pain 04/07/2022  . Hormone replacement therapy 04/07/2022  . Hyperlipidemia LDL goal <100 11/29/2020  . Perimenopause 11/24/2020  . Muscle spasm of back 11/19/2018  . Acute right-sided low back pain without sciatica 11/19/2018  . Pulsatile abdomen; no masses appreciated 11/19/2018  . Vitamin D deficiency 05/01/2017  . History of melanoma-sees Derm yearly 04/26/2017  . History of smoking 10 pack years 04/26/2017  . Family history of suicide- FATHER 04/26/2017  . IUD (intrauterine device) in place-Per GYN 04/26/2017   Past Medical History:  Diagnosis Date  . Complication of anesthesia   . PONV (postoperative nausea and vomiting)    Past Surgical History: Past Surgical History:  Procedure Laterality Date  . BREAST BIOPSY    . BREAST LUMPECTOMY WITH RADIOACTIVE SEED LOCALIZATION Right  05/30/2019   Procedure: RIGHT BREAST LUMPECTOMY WITH RADIOACTIVE SEED LOCALIZATION;  Surgeon: Harriette Bouillon, MD;  Location: [facility];  Service: General;  Laterality: Right;  . VULVA SURGERY  2019   wide excisional biopsy  . WRIST FRACTURE SURGERY  2010   Medication List:  Current Outpatient Medications  Medication Sig Dispense Refill  . Cholecalciferol (VITAMIN D3) 5000 units TABS 5,000 IU OTC vitamin D3 daily. 90 tablet 3  . estradiol (CLIMARA - DOSED IN MG/24 HR) 0.05 mg/24hr patch Place 0.05 mg onto the skin once a week.    . Multiple Vitamin (MULTIVITAMIN) capsule Take 1 capsule by mouth daily.    . progesterone (PROMETRIUM) 100 MG capsule Take 1 capsule po daily for 2 weeks every 4 weeks 14 capsule 5   No current facility-administered medications for this visit.   Allergies: Allergies  Allergen Reactions  . Codeine Nausea And Vomiting   Social History: Social History   Socioeconomic History  . Marital status: Married    Spouse name: Not on file  . Number of children: Not on file  . Years of education: Not on file  . Highest education level: Not on file  Occupational History  . Not on file  Tobacco Use  . Smoking status: Former    Current packs/day: 0.00    Average packs/day: 0.5 packs/day for 15.0 years (7.5 ttl pk-yrs)    Types: Cigarettes    Start date: 23    Quit date: 59    Years since quitting: 31.7    Passive exposure: Never  . Smokeless tobacco: Never  Vaping Use  . Vaping status: Never Used  Substance and Sexual Activity  . Alcohol use: Yes    Comment: weekends  . Drug use: No  . Sexual activity: Yes    Partners: Male    Birth control/protection: I.U.D.  Other Topics Concern  . Not on file  Social History Narrative  . Not on file   Social Determinants of Health   Financial Resource Strain: Not on file  Food Insecurity: Low Risk  (11/10/2022)   Received from [facility]   Hunger Vital Sign   . Worried About Patent examiner in the Last Year: Never true   . Ran Out of Food in the Last Year: Never true  Transportation Needs: Not on file  Physical Activity: Not on file  Stress: Not on file  Social Connections: Not on file   Lives in a ***. Smoking: *** Occupation: ***  Environmental HistorySurveyor, minerals in the house: Copywriter, advertising in the family room: {Blank single:19197::"yes","no"} Carpet in the bedroom: {Blank single:19197::"yes","no"} Heating: {Blank single:19197::"electric","gas","heat pump"} Cooling: {Blank single:19197::"central","window","heat pump"} Pet: {Blank single:19197::"yes ***","no"}  Family History: Family History  Problem Relation Age of Onset  . Breast cancer Maternal Aunt   . Breast cancer Cousin   . Lung cancer Mother   . Heart attack Father   . Depression Father   . Hyperlipidemia Father   . Diabetes Brother   . Hyperlipidemia Brother    Problem                               Relation Asthma                                   *** Eczema                                *** Food allergy                          ***  Allergic rhino conjunctivitis     ***  Review of Systems  Constitutional:  Negative for appetite change, chills, fever and unexpected weight change.  HENT:  Negative for congestion and rhinorrhea.   Eyes:  Negative for itching.  Respiratory:  Negative for cough, chest tightness, shortness of breath and wheezing.   Cardiovascular:  Negative for chest pain.  Gastrointestinal:  Negative for abdominal pain.  Genitourinary:  Negative for difficulty urinating.  Skin:  Negative for rash.  Neurological:  Negative for headaches.   Objective: There were no vitals taken for this visit. There is no height or weight on file to calculate BMI. Physical Exam Vitals and nursing note reviewed.  Constitutional:      Appearance: Normal appearance. She is well-developed.  HENT:     Head: Normocephalic and atraumatic.     Right Ear:  Tympanic membrane and external ear normal.     Left Ear: Tympanic membrane and external ear normal.     Nose: Nose normal.     Mouth/Throat:     Mouth: Mucous membranes are moist.     Pharynx: Oropharynx is clear.  Eyes:     Conjunctiva/sclera: Conjunctivae normal.  Cardiovascular:     Rate and Rhythm: Normal rate and regular rhythm.     Heart sounds: Normal heart sounds. No murmur heard.    No friction rub. No gallop.  Pulmonary:     Effort: Pulmonary effort is normal.     Breath sounds: Normal breath sounds. No wheezing, rhonchi or rales.  Musculoskeletal:     Cervical back: Neck supple.  Skin:    General: Skin is warm.     Findings: No rash.  Neurological:     Mental Status: She is alert and oriented to person, place, and time.  Psychiatric:        Behavior: Behavior normal.  The plan was reviewed with the patient/family, and all questions/concerned were addressed.  It was my pleasure to see Elpidia today and participate in her care. Please feel free to contact me with any questions or concerns.  Sincerely,  Wyline Mood, DO Allergy & Immunology  [facility] office: [phone] [facility] office: [phone]

## 2022-11-21 ENCOUNTER — Ambulatory Visit: Payer: BC Managed Care – PPO | Admitting: Family Medicine

## 2022-11-21 ENCOUNTER — Ambulatory Visit: Payer: BC Managed Care – PPO | Admitting: Allergy

## 2022-11-21 ENCOUNTER — Encounter: Payer: Self-pay | Admitting: Allergy

## 2022-11-21 ENCOUNTER — Other Ambulatory Visit: Payer: Self-pay

## 2022-11-21 VITALS — BP 124/84 | HR 78 | Temp 97.3°F | Resp 18 | Ht 61.0 in | Wt 119.1 lb

## 2022-11-21 DIAGNOSIS — K219 Gastro-esophageal reflux disease without esophagitis: Secondary | ICD-10-CM

## 2022-11-21 DIAGNOSIS — R42 Dizziness and giddiness: Secondary | ICD-10-CM

## 2022-11-21 NOTE — Patient Instructions (Addendum)
Your current symptoms do not seem to be related to environmental or food allergies.  Keep GI appointment.  Reflux See handout for lifestyle and dietary modifications. Keep journal of foods eaten and symptoms.  Follow up with PCP regarding the dizziness and lightheadedness - may need cardiac work up.  Follow up with me only as needed.

## 2022-11-30 ENCOUNTER — Ambulatory Visit: Payer: BC Managed Care – PPO | Admitting: Family Medicine

## 2022-12-06 ENCOUNTER — Ambulatory Visit: Payer: BC Managed Care – PPO | Admitting: Physician Assistant

## 2022-12-22 ENCOUNTER — Ambulatory Visit: Payer: BC Managed Care – PPO | Admitting: Physician Assistant

## 2022-12-27 DIAGNOSIS — H40013 Open angle with borderline findings, low risk, bilateral: Secondary | ICD-10-CM | POA: Diagnosis not present

## 2023-01-16 ENCOUNTER — Ambulatory Visit: Payer: BC Managed Care – PPO | Admitting: Physician Assistant

## 2023-01-24 DIAGNOSIS — H40013 Open angle with borderline findings, low risk, bilateral: Secondary | ICD-10-CM | POA: Diagnosis not present

## 2023-02-23 DIAGNOSIS — H43812 Vitreous degeneration, left eye: Secondary | ICD-10-CM | POA: Diagnosis not present

## 2023-03-23 ENCOUNTER — Encounter: Payer: Self-pay | Admitting: Family Medicine

## 2023-06-26 ENCOUNTER — Encounter: Payer: Self-pay | Admitting: Internal Medicine

## 2023-07-03 DIAGNOSIS — N9089 Other specified noninflammatory disorders of vulva and perineum: Secondary | ICD-10-CM | POA: Diagnosis not present

## 2023-07-03 DIAGNOSIS — Z01419 Encounter for gynecological examination (general) (routine) without abnormal findings: Secondary | ICD-10-CM | POA: Diagnosis not present

## 2023-07-03 DIAGNOSIS — Z79890 Hormone replacement therapy (postmenopausal): Secondary | ICD-10-CM | POA: Diagnosis not present

## 2023-07-17 ENCOUNTER — Other Ambulatory Visit: Payer: Self-pay | Admitting: Obstetrics and Gynecology

## 2023-07-17 DIAGNOSIS — Z1231 Encounter for screening mammogram for malignant neoplasm of breast: Secondary | ICD-10-CM

## 2023-07-19 ENCOUNTER — Other Ambulatory Visit: Payer: Self-pay

## 2023-07-19 DIAGNOSIS — E785 Hyperlipidemia, unspecified: Secondary | ICD-10-CM

## 2023-07-20 ENCOUNTER — Encounter: Payer: Self-pay | Admitting: Gastroenterology

## 2023-08-01 ENCOUNTER — Other Ambulatory Visit: Payer: Self-pay | Admitting: Physician Assistant

## 2023-08-01 DIAGNOSIS — R1011 Right upper quadrant pain: Secondary | ICD-10-CM

## 2023-08-01 DIAGNOSIS — R109 Unspecified abdominal pain: Secondary | ICD-10-CM | POA: Diagnosis not present

## 2023-08-08 ENCOUNTER — Other Ambulatory Visit

## 2023-08-08 DIAGNOSIS — H40023 Open angle with borderline findings, high risk, bilateral: Secondary | ICD-10-CM | POA: Diagnosis not present

## 2023-08-09 ENCOUNTER — Ambulatory Visit
Admission: RE | Admit: 2023-08-09 | Discharge: 2023-08-09 | Discharge status: Home / Self Care | Source: Ambulatory Visit | Attending: Physician Assistant | Admitting: Physician Assistant

## 2023-08-09 DIAGNOSIS — R1011 Right upper quadrant pain: Secondary | ICD-10-CM | POA: Diagnosis not present

## 2023-08-14 ENCOUNTER — Ambulatory Visit
Admission: RE | Admit: 2023-08-14 | Discharge: 2023-08-14 | Disposition: A | Discharge status: Home / Self Care | Source: Ambulatory Visit

## 2023-08-14 DIAGNOSIS — Z1231 Encounter for screening mammogram for malignant neoplasm of breast: Secondary | ICD-10-CM | POA: Diagnosis not present

## 2023-08-23 ENCOUNTER — Ambulatory Visit: Admitting: Gastroenterology

## 2023-08-23 ENCOUNTER — Encounter: Payer: Self-pay | Admitting: Gastroenterology

## 2023-08-23 VITALS — BP 116/72 | HR 88 | Ht 61.5 in | Wt 117.0 lb

## 2023-08-23 DIAGNOSIS — R1011 Right upper quadrant pain: Secondary | ICD-10-CM

## 2023-08-23 DIAGNOSIS — R1013 Epigastric pain: Secondary | ICD-10-CM

## 2023-08-23 MED ORDER — PANTOPRAZOLE SODIUM 40 MG PO TBEC: 40.0000 mg | DELAYED_RELEASE_TABLET | Freq: Every day | ORAL | 3 refills | Status: AC

## 2023-08-23 NOTE — Patient Instructions (Addendum)
 We have sent the following medications to your pharmacy for you to pick up at your convenience: Pantoprazole    Your provider has ordered Diatherix stool testing for you. You have received a kit from our office today containing all necessary supplies to complete this test. Please carefully read the stool collection instructions provided in the kit before opening the accompanying materials. In addition, be sure there is a label providing your full name and date of birth on the puritan opti-swab tube that is supplied in the kit (if you do not see a label with this information on your test tube, please make us  aware before test collection!). After completing the test, you should secure the purtian tube into the specimen biohazard bag. The [facility] E-Req sheet (including date and time of specimen collection) should be placed into the outside pocket of the specimen biohazard bag and returned to the [place] lab (basement floor of [facility]) within 3 days of collection. Please make sure to give the specimen to a staff member at the lab. DO NOT leave the specimen on the counter.   If the specimen date and time (can be found in the upper right boxed portion of the sheet) are not filled out on the E-Req sheet, the test will NOT be performed.    You have been scheduled for a HIDA scan at [facility] (1st floor) on 09/18/23. Please arrive 30 minutes prior to your scheduled appointment at  7:00 am . Make certain not to have anything to eat or drink at least 6 hours prior to your test. Should this appointment date or time not work well for you, please call radiology scheduling at [phone].  _____________________________________________________________________ hepatobiliary (HIDA) scan is an imaging procedure used to diagnose problems in the liver, gallbladder and bile ducts. In the HIDA scan, a radioactive chemical or tracer is injected into a vein in your arm. The  tracer is handled by the liver like bile. Bile is a fluid produced and excreted by your liver that helps your digestive system break down fats in the foods you eat. Bile is stored in your gallbladder and the gallbladder releases the bile when you eat a meal. A special nuclear medicine scanner (gamma camera) tracks the flow of the tracer from your liver into your gallbladder and small intestine.  During your HIDA scan  You'll be asked to change into a hospital gown before your HIDA scan begins. Your health care team will position you on a table, usually on your back. The radioactive tracer is then injected into a vein in your arm.The tracer travels through your bloodstream to your liver, where it's taken up by the bile-producing cells. The radioactive tracer travels with the bile from your liver into your gallbladder and through your bile ducts to your small intestine.You may feel some pressure while the radioactive tracer is injected into your vein. As you lie on the table, a special gamma camera is positioned over your abdomen taking pictures of the tracer as it moves through your body. The gamma camera takes pictures continually for about an hour. You'll need to keep still during the HIDA scan. This can become uncomfortable, but you may find that you can lessen the discomfort by taking deep breaths and thinking about other things. Tell your health care team if you're uncomfortable. The radiologist will watch on a computer the progress of the radioactive tracer through your body. The HIDA scan may be stopped when the radioactive tracer  is seen in the gallbladder and enters your small intestine. This typically takes about an hour. In some cases extra imaging will be performed if original images aren't satisfactory, if morphine is given to help visualize the gallbladder or if the medication CCK is given to look at the contraction of the gallbladder. This test typically takes 2 hours to  complete. ________________________________________________________________________   Follow-up on : 09/27/23 @ 2:30 pm  with Camie Furbish, PA-C  Due to recent changes in healthcare laws, you may see the results of your imaging and laboratory studies on MyChart before your provider has had a chance to review them.  We understand that in some cases there may be results that are confusing or concerning to you. Not all laboratory results come back in the same time frame and the provider may be waiting for multiple results in order to interpret others.  Please give us  48 hours in order for your provider to thoroughly review all the results before contacting the office for clarification of your results.   Thank you for choosing me and [facility].  Camie Furbish, PA-C

## 2023-08-23 NOTE — Progress Notes (Signed)
 Brenda Ashley [id number] 1965/03/17   Chief Complaint: Abdominal pain  Referring Provider: Wallace Joesph LABOR, PA Primary GI MD: Sampson  HPI: Brenda Ashley is a 58 y.o. female with past medical history of HLD who presents today for a complaint of abdominal pain.    Seen by PCP 10/20/2022 for right-sided abdominal pain.  Normal CBC, CMP, TSH, negative Hep C.  RUQ US  08/09/2023 showed a normal gallbladder, no acute abnormality in the right upper quadrant.  Negative Cologuard 11/08/2022.   Patient states she has been having RUQ abdominal pain for the last month. Feels pain under her right lower ribcage. When she bends forward it feels like something is stabbing her in this area. Feels almost like gas pain but passing gas doesn't help.  Has had similar symptoms before. In 2023 had a kidney infection.  End of last summer had similar pain after lifting weights which was determined to be muscular and which resolved. She had made an appointment to see GI, but ended up not needing evaluation.   She has tried making dietary changes. Makes all meals at home, eats a clean diet, has tried eliminating lactose without improvement.   She is very active, does regular strength training and walking. Over the last month stopped lifting weights to see if pain would improve. It did not, and she started lifting again on Sunday but ended up having diffuse abdominal soreness.  She notices pain in RUQ is worse with eating. Has been to see ENT and allergist and was told she had reflux. Tried Nexium for a couple weeks with some relief of vague epigastric discomfort but no longer taking. Has not had pyrosis or acid reflux. Unsure if she has indigestion. Last fall was noticing globus sensation.  Her abdominal pain occurs daily, seems like it is always there.  Symptoms can be worse after eating, and after eating she notices more pain in epigastrium.  She has a bowel movement daily and denies any constipation,  diarrhea, blood in her stool, or melena.  Denies family history of colon cancer.  Her brother had a colonoscopy in his early 14s and had some polyps.  Labs 06/2023 with unremarkable CBC, CMP, TSH, vitamin B12, vitamin D  (viewed on patient's phone, outside records)   Previous GI Procedures/Imaging   RUQ US  08/09/2023 Normal gallbladder. No acute abnormality noted in the right upper quadrant.   Past Medical History:  Diagnosis Date   Complication of anesthesia    PONV (postoperative nausea and vomiting)    Recurrent upper respiratory infection (URI)     Past Surgical History:  Procedure Laterality Date   BREAST BIOPSY     BREAST LUMPECTOMY WITH RADIOACTIVE SEED LOCALIZATION Right 05/30/2019   Procedure: RIGHT BREAST LUMPECTOMY WITH RADIOACTIVE SEED LOCALIZATION;  Surgeon: Vanderbilt Ned, MD;  Location: [facility];  Service: General;  Laterality: Right;   VULVA SURGERY  2019   wide excisional biopsy   WRIST FRACTURE SURGERY  2010    Current Outpatient Medications  Medication Sig Dispense Refill   Cholecalciferol (VITAMIN D3) 5000 units TABS 5,000 IU OTC vitamin D3 daily. 90 tablet 3   estradiol  (CLIMARA  - DOSED IN MG/24 HR) 0.05 mg/24hr patch Place 0.05 mg onto the skin once a week.     MAGNESIUM PO Take by mouth.     Multiple Vitamin (MULTIVITAMIN) capsule Take 1 capsule by mouth daily.     progesterone  (PROMETRIUM ) 100 MG capsule Take 1 capsule po daily for 2 weeks every 4 weeks  14 capsule 5   No current facility-administered medications for this visit.    Allergies as of 08/23/2023 - Review Complete 11/21/2022  Allergen Reaction Noted   Codeine Nausea And Vomiting 01/07/2019    Family History  Problem Relation Age of Onset   Lung cancer Mother    Allergic rhinitis Father    Heart attack Father    Depression Father    Hyperlipidemia Father    Allergic rhinitis Brother    Diabetes Brother    Hyperlipidemia Brother    Breast cancer Maternal Aunt     Breast cancer Cousin    Eczema Neg Hx    Urticaria Neg Hx    Asthma Neg Hx     Social History   Tobacco Use   Smoking status: Former    Current packs/day: 0.00    Average packs/day: 0.5 packs/day for 15.0 years (7.5 ttl pk-yrs)    Types: Cigarettes    Start date: 73    Quit date: 1993    Years since quitting: 32.5    Passive exposure: Never   Smokeless tobacco: Never  Vaping Use   Vaping status: Never Used  Substance Use Topics   Alcohol use: Yes    Comment: weekends   Drug use: No     Review of Systems:    Constitutional: No weight loss, fever, chills, weakness or fatigue Skin: No rash or itching Cardiovascular: No chest pain, chest pressure or palpitations   Respiratory: No SOB or cough Gastrointestinal: See HPI and otherwise negative Genitourinary: No dysuria or change in urinary frequency Neurological: No headache, dizziness or syncope Musculoskeletal: No new muscle or joint pain Hematologic: No bleeding or bruising    Physical Exam:  Vital signs: BP 116/72 (BP Location: Left Arm, Patient Position: Sitting, Cuff Size: Normal)   Pulse 88   Ht 5' 1.5 (1.562 m) Comment: height measured without shoes  Wt 117 lb (53.1 kg)   BMI 21.75 kg/m    Wt Readings from Last 3 Encounters:  08/23/23 117 lb (53.1 kg)  11/21/22 119 lb 1.6 oz (54 kg)  10/20/22 117 lb (53.1 kg)     Constitutional: NAD, Well developed, Well nourished, alert and cooperative Head:  Normocephalic and atraumatic.  Eyes: No scleral icterus. Conjunctiva pink. Mouth: No oral lesions. Respiratory: Respirations even and unlabored. Lungs clear to auscultation bilaterally.  No wheezes, crackles, or rhonchi.  Cardiovascular:  Regular rate and rhythm. No murmurs. No peripheral edema. Gastrointestinal:  Soft, nondistended, tender to palpation of right upper quadrant and to palpation of right lower ribs anteriorly and laterally. No rebound or guarding. Normal bowel sounds. No appreciable masses or  hepatomegaly. Rectal:  Not performed.  Neurologic:  Alert and oriented x4;  grossly normal neurologically.  Skin:   Dry and intact without significant lesions or rashes. Psychiatric: Oriented to person, place and time. Demonstrates good judgement and reason without abnormal affect or behaviors.   RELEVANT LABS AND IMAGING: CBC    Component Value Date/Time   WBC 6.0 10/26/2022 1450   WBC 5.4 05/27/2019 0932   RBC 4.32 10/26/2022 1450   RBC 4.44 05/27/2019 0932   HGB 13.9 10/26/2022 1450   HCT 42.2 10/26/2022 1450   PLT 228 10/26/2022 1450   MCV 98 (H) 10/26/2022 1450   MCH 32.2 10/26/2022 1450   MCH 32.2 05/27/2019 0932   MCHC 32.9 10/26/2022 1450   MCHC 33.6 05/27/2019 0932   RDW 11.7 10/26/2022 1450   LYMPHSABS 2.1 10/26/2022 1450   MONOABS  0.8 05/27/2019 0932   EOSABS 0.0 10/26/2022 1450   BASOSABS 0.0 10/26/2022 1450    CMP     Component Value Date/Time   NA 142 10/26/2022 1450   K 4.3 10/26/2022 1450   CL 103 10/26/2022 1450   CO2 26 10/26/2022 1450   GLUCOSE 117 (H) 10/26/2022 1450   GLUCOSE 93 05/27/2019 0932   BUN 19 10/26/2022 1450   CREATININE 0.85 10/26/2022 1450   CALCIUM 9.3 10/26/2022 1450   PROT 6.7 10/26/2022 1450   ALBUMIN 4.2 10/26/2022 1450   AST 16 10/26/2022 1450   ALT 16 10/26/2022 1450   ALKPHOS 58 10/26/2022 1450   BILITOT 0.5 10/26/2022 1450   GFRNONAA >60 05/27/2019 0932   GFRAA >60 05/27/2019 0932     Assessment/Plan:   RUQ abdominal pain Epigastric abdominal pain Dyspepsia Patient with about 1 month of right upper quadrant abdominal pain.  Has had a normal right upper quadrant ultrasound.  Has had similar pain in the past which she attributed to muscular pain.  She is very active and does weight training and regular walking.  Has taken a break from weight training to see if this would improve her pain and it has not.  She is tender to palpation of the right upper quadrant as well as the right lower ribs.  Could be musculoskeletal  pain, but will pursue further workup to rule out GI source.  She does notice worsening pain after meals. She did have some improvement in vague upper GI symptoms such as epigastric discomfort and dyspepsia on Nexium 20 mg, which she took for a couple weeks and then discontinued.  - Will order HIDA scan for further evaluation of the gallbladder. - Check [facility] H. Pylori - Start pantoprazole  40 mg daily for 6 weeks - Pain persists, consider EGD for further evaluation   Camie Furbish, PA-C [facility] 08/23/2023, 12:42 PM  Patient Care Team: Wallace Joesph LABOR, PA as PCP - General (Family Medicine) Court Pulling, MD as Referring Physician (Dermatology) Hansen-Lindner, Sonny HERO, MD (Obstetrics and Gynecology)

## 2023-08-25 DIAGNOSIS — R1013 Epigastric pain: Secondary | ICD-10-CM | POA: Diagnosis not present

## 2023-08-25 DIAGNOSIS — R1011 Right upper quadrant pain: Secondary | ICD-10-CM | POA: Diagnosis not present

## 2023-09-04 ENCOUNTER — Telehealth: Payer: Self-pay | Admitting: Gastroenterology

## 2023-09-04 NOTE — Telephone Encounter (Signed)
 Patient calling in regards to stool test results. Please advise.   Thank you

## 2023-09-04 NOTE — Telephone Encounter (Signed)
 [facility] H. Pylori results received. Negative for H. Pylori 08/26/2023. Patient informed (see phone note).

## 2023-09-04 NOTE — Telephone Encounter (Signed)
 Called and spoke with patient to inform her that results are being re-faxed after speaking with representative at [facility]. Will contact patient once we have received results.

## 2023-09-04 NOTE — Telephone Encounter (Signed)
 Good Morning Camie,   Have you seen results for stool study? If not I can reach out to [facility]?

## 2023-09-04 NOTE — Telephone Encounter (Addendum)
 Results of H-pylori stool studies were negative.Patient has been informed.

## 2023-09-12 ENCOUNTER — Telehealth: Payer: Self-pay | Admitting: Gastroenterology

## 2023-09-12 MED ORDER — LIDOCAINE-PRILOCAINE 2.5-2.5 % EX CREA: 1.0000 | TOPICAL_CREAM | CUTANEOUS | 0 refills | Status: AC | PRN

## 2023-09-12 NOTE — Telephone Encounter (Signed)
 Camie see message regarding alternative testing and anxiety

## 2023-09-12 NOTE — Telephone Encounter (Signed)
 The prescription has been sent to the pharmacy. Pt aware-  Brenda Ashley she has used in the past. She states that they do not offer any creams or sprays.

## 2023-09-12 NOTE — Telephone Encounter (Signed)
 Left message on machine to call back

## 2023-09-12 NOTE — Telephone Encounter (Signed)
 Inbound call from patient stating she did not realize the NM HEPATO W/ EF was with an IV and is stating she would like another test done due to not doing well with ivs.she is sched for tomorrow at 7am  Also would like to know if she can be prescribed volume since her anxiety is now provoked  Requesting a call back  Please advise  Thank you

## 2023-09-12 NOTE — Telephone Encounter (Signed)
 Brenda Ashley the pt states she will proceed with the HIDA scan if you can send in a prescription for EMLA  cream.  She states that the tech told her they could use it but can not provide it. She does not have a PCP at this time.  She has an appt coming up but not until Sept.

## 2023-09-13 ENCOUNTER — Encounter (HOSPITAL_COMMUNITY)
Admission: RE | Admit: 2023-09-13 | Discharge: 2023-09-13 | Disposition: A | Discharge status: Home / Self Care | Source: Ambulatory Visit | Attending: Gastroenterology | Admitting: Gastroenterology

## 2023-09-13 DIAGNOSIS — R1013 Epigastric pain: Secondary | ICD-10-CM | POA: Diagnosis not present

## 2023-09-13 DIAGNOSIS — R1011 Right upper quadrant pain: Secondary | ICD-10-CM | POA: Diagnosis not present

## 2023-09-13 MED ORDER — TECHNETIUM TC 99M MEBROFENIN IV KIT
5.1000 | PACK | Freq: Once | INTRAVENOUS | Status: AC
  Administered 2023-09-13: 5.1 via INTRAVENOUS

## 2023-09-15 ENCOUNTER — Ambulatory Visit: Payer: Self-pay | Admitting: Gastroenterology

## 2023-09-18 ENCOUNTER — Ambulatory Visit (HOSPITAL_COMMUNITY)

## 2023-09-18 DIAGNOSIS — R1011 Right upper quadrant pain: Secondary | ICD-10-CM | POA: Diagnosis not present

## 2023-09-18 DIAGNOSIS — R140 Abdominal distension (gaseous): Secondary | ICD-10-CM | POA: Diagnosis not present

## 2023-09-18 DIAGNOSIS — R102 Pelvic and perineal pain: Secondary | ICD-10-CM | POA: Diagnosis not present

## 2023-09-20 ENCOUNTER — Ambulatory Visit: Admitting: Pediatrics

## 2023-09-20 ENCOUNTER — Other Ambulatory Visit (HOSPITAL_COMMUNITY)

## 2023-09-20 DIAGNOSIS — R102 Pelvic and perineal pain: Secondary | ICD-10-CM | POA: Diagnosis not present

## 2023-09-27 ENCOUNTER — Encounter: Payer: Self-pay | Admitting: Gastroenterology

## 2023-09-27 ENCOUNTER — Ambulatory Visit (INDEPENDENT_AMBULATORY_CARE_PROVIDER_SITE_OTHER): Admitting: Gastroenterology

## 2023-09-27 VITALS — BP 108/60 | HR 81 | Ht 61.42 in | Wt 117.4 lb

## 2023-09-27 DIAGNOSIS — R1084 Generalized abdominal pain: Secondary | ICD-10-CM

## 2023-09-27 DIAGNOSIS — R1011 Right upper quadrant pain: Secondary | ICD-10-CM

## 2023-09-27 MED ORDER — HYOSCYAMINE SULFATE 0.125 MG SL SUBL: 0.1250 mg | SUBLINGUAL_TABLET | Freq: Three times a day (TID) | SUBLINGUAL | 1 refills | Status: AC | PRN

## 2023-09-27 NOTE — Progress Notes (Signed)
 Brenda Ashley [id number] 08-06-1965   Chief Complaint: Abdominal pain  Referring Provider: Wallace Joesph LABOR, PA Primary GI MD: Dr. Shila  HPI: Brenda Ashley is a 58 y.o. female with past medical history of HLD who presents today for a complaint of abdominal pain.     Seen by PCP 10/20/2022 for right-sided abdominal pain.  Normal CBC, CMP, TSH, negative Hep C.   RUQ US  08/09/2023 showed a normal gallbladder, no acute abnormality in the right upper quadrant.   Negative Cologuard 11/08/2022.  Labs 06/2023 with unremarkable CBC, CMP, TSH, vitamin B12, vitamin D  (viewed on patient's phone, outside records)   Patient last seen in office 08/23/2023 with complaint of 1 month right upper quadrant abdominal pain.  Has had a normal RUQ ultrasound.  Endorsed similar pain in the past which was attributed to musculoskeletal pain.  She is very active and does weight training and regular walking, but had taken a break from weight training to see if it would improve her pain and it did not.  On exam she was tender to palpation of the RUQ as well as right lower ribs.  Worsening pain after meals.  Also endorsed some vague upper GI symptoms such as epigastric discomfort and dyspepsia on Nexium 20 mg which she took for couple weeks and then discontinued.  She was started on pantoprazole  40 mg daily for 6 weeks.  HIDA scan was ordered for further evaluation of the gallbladder and was negative. Negative for H. pylori.  She had follow-up with OB/GYN recently ([facility]) and CT A/P was ordered.  Has also had a pelvic ultrasound which was normal with no acute findings.   Patient states she continues to have RUQ abdominal pain, ongoing now for about 3 months.  States her insurance denied CT scan ordered by her OB/GYN.  RUQ pain is worse with bending over, sometimes has back pain on that side as well.  States that there are days that her whole abdomen hurts.  Pain is not severe but is bothersome, and noticeable  daily.  Sometimes pain can last all day.  She is having regular bowel movements and denies any blood in her stool or melena.  Denies any family history of colon or stomach cancer.  Denies improvement on pantoprazole .  Denies dysphagia,, heartburn, reflux.  Though pain is not severe, she is very concerned that it has persisted this long.  Previous GI Procedures/Imaging   RUQ US  08/09/2023 Normal gallbladder. No acute abnormality noted in the right upper quadrant.   Past Medical History:  Diagnosis Date   Complication of anesthesia    Glaucoma    HLD (hyperlipidemia)    PONV (postoperative nausea and vomiting)    Recurrent upper respiratory infection (URI)     Past Surgical History:  Procedure Laterality Date   BREAST BIOPSY Right    BREAST LUMPECTOMY WITH RADIOACTIVE SEED LOCALIZATION Right 05/30/2019   Procedure: RIGHT BREAST LUMPECTOMY WITH RADIOACTIVE SEED LOCALIZATION;  Surgeon: Vanderbilt Ned, MD;  Location: [facility];  Service: General;  Laterality: Right;   VULVA SURGERY  2019   wide excisional biopsy   WRIST FRACTURE SURGERY Left 2010    Current Outpatient Medications  Medication Sig Dispense Refill   Cholecalciferol (VITAMIN D3) 5000 units TABS 5,000 IU OTC vitamin D3 daily. 90 tablet 3   Estradiol  0.52 MG/0.87 GM (0.06%) GEL Apply 1 Application topically daily.     latanoprost (XALATAN) 0.005 % ophthalmic solution Place 1 drop into both eyes at bedtime.  lidocaine -prilocaine  (EMLA ) cream Apply 1 Application topically as needed. 30 g 0   MAGNESIUM PO Take by mouth.     Multiple Vitamin (MULTIVITAMIN) capsule Take 1 capsule by mouth daily.     pantoprazole  (PROTONIX ) 40 MG tablet Take 1 tablet (40 mg total) by mouth daily. 30 tablet 3   progesterone  (PROMETRIUM ) 200 MG capsule Take 200 mg by mouth daily.     No current facility-administered medications for this visit.    Allergies as of 09/27/2023 - Review Complete 08/23/2023  Allergen  Reaction Noted   Codeine Nausea And Vomiting 01/07/2019    Family History  Problem Relation Age of Onset   Lung cancer Mother    Endometrial cancer Mother    Allergic rhinitis Father    Heart attack Father    Depression Father    Hyperlipidemia Father    Allergic rhinitis Brother    Diabetes Brother    Hyperlipidemia Brother    HIV/AIDS Brother    Breast cancer Maternal Aunt    Breast cancer Cousin    Asthma Son    Allergies Son    Eczema Neg Hx    Urticaria Neg Hx     Social History   Tobacco Use   Smoking status: Former    Current packs/day: 0.00    Average packs/day: 0.5 packs/day for 15.0 years (7.5 ttl pk-yrs)    Types: Cigarettes    Start date: 40    Quit date: 1993    Years since quitting: 32.6    Passive exposure: Never   Smokeless tobacco: Never  Vaping Use   Vaping status: Never Used  Substance Use Topics   Alcohol use: Yes    Comment: 2-3 a month   Drug use: No     Review of Systems:    Constitutional: No unexplained weight loss, fever, chills, weakness Cardiovascular: No chest pain Respiratory: No SOB  Gastrointestinal: See HPI and otherwise negative Hematologic: No bleeding or bruising    Physical Exam:  Vital signs: BP 108/60   Pulse 81   Ht 5' 1.42 (1.56 m) Comment: height without shoes  Wt 117 lb 6 oz (53.2 kg)   BMI 21.88 kg/m    Wt Readings from Last 3 Encounters:  09/27/23 117 lb 6 oz (53.2 kg)  08/23/23 117 lb (53.1 kg)  11/21/22 119 lb 1.6 oz (54 kg)     Constitutional: Pleasant, well-appearing female in NAD, alert and cooperative Head:  Normocephalic and atraumatic.  Eyes: No scleral icterus. Respiratory: Respirations even and unlabored. Lungs clear to auscultation bilaterally.  No wheezes, crackles, or rhonchi.  Cardiovascular:  Regular rate and rhythm. No murmurs. No peripheral edema. Gastrointestinal:  Soft, nondistended, mild tenderness to palpation of right abdomen. No rebound or guarding. Normal bowel sounds. No  appreciable masses or hepatomegaly. Rectal:  Not performed.  Neurologic:  Alert and oriented x4;  grossly normal neurologically.  Skin:   Dry and intact without significant lesions or rashes. Psychiatric: Oriented to person, place and time. Demonstrates good judgement and reason without abnormal affect or behaviors.   RELEVANT LABS AND IMAGING: CBC    Component Value Date/Time   WBC 6.0 10/26/2022 1450   WBC 5.4 05/27/2019 0932   RBC 4.32 10/26/2022 1450   RBC 4.44 05/27/2019 0932   HGB 13.9 10/26/2022 1450   HCT 42.2 10/26/2022 1450   PLT 228 10/26/2022 1450   MCV 98 (H) 10/26/2022 1450   MCH 32.2 10/26/2022 1450   MCH 32.2 05/27/2019 0932  MCHC 32.9 10/26/2022 1450   MCHC 33.6 05/27/2019 0932   RDW 11.7 10/26/2022 1450   LYMPHSABS 2.1 10/26/2022 1450   MONOABS 0.8 05/27/2019 0932   EOSABS 0.0 10/26/2022 1450   BASOSABS 0.0 10/26/2022 1450    CMP     Component Value Date/Time   NA 142 10/26/2022 1450   K 4.3 10/26/2022 1450   CL 103 10/26/2022 1450   CO2 26 10/26/2022 1450   GLUCOSE 117 (H) 10/26/2022 1450   GLUCOSE 93 05/27/2019 0932   BUN 19 10/26/2022 1450   CREATININE 0.85 10/26/2022 1450   CALCIUM 9.3 10/26/2022 1450   PROT 6.7 10/26/2022 1450   ALBUMIN 4.2 10/26/2022 1450   AST 16 10/26/2022 1450   ALT 16 10/26/2022 1450   ALKPHOS 58 10/26/2022 1450   BILITOT 0.5 10/26/2022 1450   GFRNONAA >60 05/27/2019 0932   GFRAA >60 05/27/2019 0932     Assessment/Plan:   Generalized abdominal pain RUQ abdominal pain Patient seen today for follow-up of abdominal pain.  Pain primarily localized to RUQ but has had generalized abdominal pain at times.  Tenderness to right side of abdomen on exam today.  So far workup has been unremarkable.  She has been unable to identify any clear inciting factors other than that bending forward causes worsening pain in RUQ.  RUQ US  was unremarkable, HIDA scan was normal, H. pylori negative, and she has since had a pelvic ultrasound  with her OB/GYN which was also normal.  They ordered a CT scan which was denied by her insurance. She has had no change in bowel habits.  I do think at this point a CT scan is warranted and we will try again to get this covered for her.  - Order CT A/P - Recommend trial of OTC lidocaine  patches for possible musculoskeletal pain - Trial of hyoscyamine  0.125 mg every 8 hours as needed - If no improvement at follow-up and CT unrevealing consider diagnostic EGD/colonoscopy   Camie Furbish, PA-C [facility] 09/27/2023, 12:30 PM  Patient Care Team: Armand Sonny HERO, MD as PCP - General (Obstetrics and Gynecology) Court Pulling, MD as Referring Physician (Dermatology) Hansen-Lindner, Sonny HERO, MD (Obstetrics and Gynecology)

## 2023-09-27 NOTE — Progress Notes (Unsigned)
 Brenda Ashley [id number] March 02, 1965   Chief Complaint:  Referring Provider: Wallace Joesph LABOR, PA Primary GI MD: Dr. Shila  HPI: Brenda Ashley is a 58 y.o. female with past medical history of HLD who presents today for a complaint of abdominal pain.     Seen by PCP 10/20/2022 for right-sided abdominal pain.  Normal CBC, CMP, TSH, negative Hep C.   RUQ US  08/09/2023 showed a normal gallbladder, no acute abnormality in the right upper quadrant.   Negative Cologuard 11/08/2022.  Labs 06/2023 with unremarkable CBC, CMP, TSH, vitamin B12, vitamin D  (viewed on patient's phone, outside records)   Patient last seen in office 08/23/2023 with complaint of 1 month right upper quadrant abdominal pain.  Has had a normal RUQ ultrasound.  Endorsed similar pain in the past which was attributed to musculoskeletal pain.  She is very active and does weight training and regular walking, but had taken a break from weight training to see if it would improve her pain and it did not.  On exam she was tender to palpation of the RUQ as well as right lower ribs.  Worsening pain after meals.  Also endorsed some vague upper GI symptoms such as epigastric discomfort and dyspepsia on Nexium 20 mg which she took for couple weeks and then discontinued.  She was started on pantoprazole  40 mg daily for 6 weeks.  HIDA scan was ordered for further evaluation of the gallbladder and was negative. Negative for H. pylori.  She had follow-up with OB/GYN recently ([facility]) and CT A/P was ordered.  Has also had a pelvic ultrasound which was normal with no acute findings.   Discussed next step would likely be EGD  Insurance denied CT scan  Still in RUQ, worse with bending over, sometimes back hurts as well Some days whole abdomen hurts Pain not severe but bothersome After leaving here had worse pain Pain can last all day at times  No trouble swallowing  No family history of colon or stomach cancer  Regular bowel  movements, no blood or black stools Did back off on fiber for a little bit   Previous GI Procedures/Imaging   RUQ US  08/09/2023 Normal gallbladder. No acute abnormality noted in the right upper quadrant.   Past Medical History:  Diagnosis Date   Complication of anesthesia    Glaucoma    HLD (hyperlipidemia)    PONV (postoperative nausea and vomiting)    Recurrent upper respiratory infection (URI)     Past Surgical History:  Procedure Laterality Date   BREAST BIOPSY Right    BREAST LUMPECTOMY WITH RADIOACTIVE SEED LOCALIZATION Right 05/30/2019   Procedure: RIGHT BREAST LUMPECTOMY WITH RADIOACTIVE SEED LOCALIZATION;  Surgeon: Vanderbilt Ned, MD;  Location: [facility];  Service: General;  Laterality: Right;   VULVA SURGERY  2019   wide excisional biopsy   WRIST FRACTURE SURGERY Left 2010    Current Outpatient Medications  Medication Sig Dispense Refill   Cholecalciferol (VITAMIN D3) 5000 units TABS 5,000 IU OTC vitamin D3 daily. 90 tablet 3   Estradiol  0.52 MG/0.87 GM (0.06%) GEL Apply 1 Application topically daily.     latanoprost (XALATAN) 0.005 % ophthalmic solution Place 1 drop into both eyes at bedtime.     lidocaine -prilocaine  (EMLA ) cream Apply 1 Application topically as needed. 30 g 0   MAGNESIUM PO Take by mouth.     Multiple Vitamin (MULTIVITAMIN) capsule Take 1 capsule by mouth daily.     pantoprazole  (PROTONIX ) 40 MG tablet Take  1 tablet (40 mg total) by mouth daily. 30 tablet 3   progesterone  (PROMETRIUM ) 200 MG capsule Take 200 mg by mouth daily.     No current facility-administered medications for this visit.    Allergies as of 09/27/2023 - Review Complete 08/23/2023  Allergen Reaction Noted   Codeine Nausea And Vomiting 01/07/2019    Family History  Problem Relation Age of Onset   Lung cancer Mother    Endometrial cancer Mother    Allergic rhinitis Father    Heart attack Father    Depression Father    Hyperlipidemia Father     Allergic rhinitis Brother    Diabetes Brother    Hyperlipidemia Brother    HIV/AIDS Brother    Breast cancer Maternal Aunt    Breast cancer Cousin    Asthma Son    Allergies Son    Eczema Neg Hx    Urticaria Neg Hx     Social History   Tobacco Use   Smoking status: Former    Current packs/day: 0.00    Average packs/day: 0.5 packs/day for 15.0 years (7.5 ttl pk-yrs)    Types: Cigarettes    Start date: 100    Quit date: 1993    Years since quitting: 32.6    Passive exposure: Never   Smokeless tobacco: Never  Vaping Use   Vaping status: Never Used  Substance Use Topics   Alcohol use: Yes    Comment: 2-3 a month   Drug use: No     Review of Systems:    Constitutional: No weight loss, fever, chills, weakness or fatigue Eyes: No change in vision Ears, Nose, Throat:  No change in hearing or congestion Skin: No rash or itching Cardiovascular: No chest pain, chest pressure or palpitations   Respiratory: No SOB or cough Gastrointestinal: See HPI and otherwise negative Genitourinary: No dysuria or change in urinary frequency Neurological: No headache, dizziness or syncope Musculoskeletal: No new muscle or joint pain Hematologic: No bleeding or bruising    Physical Exam:  Vital signs: There were no vitals taken for this visit.  Constitutional: NAD, Well developed, Well nourished, alert and cooperative Head:  Normocephalic and atraumatic.  Eyes: No scleral icterus. Conjunctiva pink. Mouth: No oral lesions. Respiratory: Respirations even and unlabored. Lungs clear to auscultation bilaterally.  No wheezes, crackles, or rhonchi.  Cardiovascular:  Regular rate and rhythm. No murmurs. No peripheral edema. Gastrointestinal:  Soft, nondistended, nontender. No rebound or guarding. Normal bowel sounds. No appreciable masses or hepatomegaly. Rectal:  Not performed.  Neurologic:  Alert and oriented x4;  grossly normal neurologically.  Skin:   Dry and intact without significant  lesions or rashes. Psychiatric: Oriented to person, place and time. Demonstrates good judgement and reason without abnormal affect or behaviors.   RELEVANT LABS AND IMAGING: CBC    Component Value Date/Time   WBC 6.0 10/26/2022 1450   WBC 5.4 05/27/2019 0932   RBC 4.32 10/26/2022 1450   RBC 4.44 05/27/2019 0932   HGB 13.9 10/26/2022 1450   HCT 42.2 10/26/2022 1450   PLT 228 10/26/2022 1450   MCV 98 (H) 10/26/2022 1450   MCH 32.2 10/26/2022 1450   MCH 32.2 05/27/2019 0932   MCHC 32.9 10/26/2022 1450   MCHC 33.6 05/27/2019 0932   RDW 11.7 10/26/2022 1450   LYMPHSABS 2.1 10/26/2022 1450   MONOABS 0.8 05/27/2019 0932   EOSABS 0.0 10/26/2022 1450   BASOSABS 0.0 10/26/2022 1450    CMP     Component  Value Date/Time   NA 142 10/26/2022 1450   K 4.3 10/26/2022 1450   CL 103 10/26/2022 1450   CO2 26 10/26/2022 1450   GLUCOSE 117 (H) 10/26/2022 1450   GLUCOSE 93 05/27/2019 0932   BUN 19 10/26/2022 1450   CREATININE 0.85 10/26/2022 1450   CALCIUM 9.3 10/26/2022 1450   PROT 6.7 10/26/2022 1450   ALBUMIN 4.2 10/26/2022 1450   AST 16 10/26/2022 1450   ALT 16 10/26/2022 1450   ALKPHOS 58 10/26/2022 1450   BILITOT 0.5 10/26/2022 1450   GFRNONAA >60 05/27/2019 0932   GFRAA >60 05/27/2019 0932     Assessment/Plan:    CT A/P with contrast Hyoscyamine  OTC lidocaine  patches EGD/colon if no improvement    Camie Furbish, PA-C [facility] 09/27/2023, 12:30 PM  Patient Care Team: Armand Sonny HERO, MD as PCP - General (Obstetrics and Gynecology) Court Pulling, MD as Referring Physician (Dermatology) Hansen-Lindner, Sonny HERO, MD (Obstetrics and Gynecology)

## 2023-09-27 NOTE — Patient Instructions (Addendum)
 We have sent the following medications to your pharmacy for you to pick up at your convenience: Hyoscyamine   [facility]:  Address: [address] [place], [place], [place] [address] Phone: [phone]  Please contact [facility] if you have not heard from them within 2 days to schedule your CT scan. Please see information listed above.   Follow-up in 6 weeks on :11/17/2023 @ 9:00 am   _______________________________________________________  If your blood pressure at your visit was 140/90 or greater, please contact your primary care physician to follow up on this.  _______________________________________________________  If you are age 25 or older, your body mass index should be between 23-30. Your Body mass index is 21.88 kg/m. If this is out of the aforementioned range listed, please consider follow up with your Primary Care Provider.  If you are age 49 or younger, your body mass index should be between 19-25. Your Body mass index is 21.88 kg/m. If this is out of the aformentioned range listed, please consider follow up with your Primary Care Provider.   ________________________________________________________  The [facility] providers would like to encourage you to use MYCHART to communicate with providers for non-urgent requests or questions.  Due to long hold times on the telephone, sending your provider a message by [facility] may be a faster and more efficient way to get a response.  Please allow 48 business hours for a response.  Please remember that this is for non-urgent requests.  _______________________________________________________  [facility] is using a team-based approach to care.  Your team is made up of your doctor and two to three APPS. Our APPS (Nurse Practitioners and Physician Assistants) work with your physician to ensure care continuity for you. They are fully qualified to address your health concerns and develop a treatment plan. They communicate  directly with your gastroenterologist to care for you. Seeing the Advanced Practice Practitioners on your physician's team can help you by facilitating care more promptly, often allowing for earlier appointments, access to diagnostic testing, procedures, and other specialty referrals.   Thank you for choosing me and [facility].  Camie Furbish, PA-C

## 2023-09-28 ENCOUNTER — Encounter: Payer: Self-pay | Admitting: Gastroenterology

## 2023-10-02 ENCOUNTER — Inpatient Hospital Stay: Admission: RE | Admit: 2023-10-02 | Source: Ambulatory Visit

## 2023-10-02 ENCOUNTER — Ambulatory Visit
Admission: RE | Admit: 2023-10-02 | Discharge: 2023-10-02 | Disposition: A | Discharge status: Home / Self Care | Source: Ambulatory Visit | Attending: Gastroenterology | Admitting: Gastroenterology

## 2023-10-02 DIAGNOSIS — R1084 Generalized abdominal pain: Secondary | ICD-10-CM | POA: Diagnosis not present

## 2023-10-02 MED ORDER — IOPAMIDOL (ISOVUE-300) INJECTION 61%
75.0000 mL | Freq: Once | INTRAVENOUS | Status: AC | PRN
  Administered 2023-10-02: 75 mL via INTRAVENOUS

## 2023-10-03 ENCOUNTER — Ambulatory Visit: Payer: Self-pay | Admitting: Gastroenterology

## 2023-10-09 ENCOUNTER — Other Ambulatory Visit: Payer: Self-pay

## 2023-10-09 DIAGNOSIS — R1011 Right upper quadrant pain: Secondary | ICD-10-CM

## 2023-10-09 DIAGNOSIS — R1084 Generalized abdominal pain: Secondary | ICD-10-CM

## 2023-10-09 MED ORDER — NA SULFATE-K SULFATE-MG SULF 17.5-3.13-1.6 GM/177ML PO SOLN: 1.0000 | Freq: Once | ORAL | 0 refills | Status: AC

## 2023-10-17 DIAGNOSIS — E559 Vitamin D deficiency, unspecified: Secondary | ICD-10-CM | POA: Diagnosis not present

## 2023-10-17 DIAGNOSIS — Z833 Family history of diabetes mellitus: Secondary | ICD-10-CM | POA: Diagnosis not present

## 2023-10-17 DIAGNOSIS — F419 Anxiety disorder, unspecified: Secondary | ICD-10-CM | POA: Diagnosis not present

## 2023-10-17 DIAGNOSIS — Z7689 Persons encountering health services in other specified circumstances: Secondary | ICD-10-CM | POA: Diagnosis not present

## 2023-10-17 DIAGNOSIS — Z23 Encounter for immunization: Secondary | ICD-10-CM | POA: Diagnosis not present

## 2023-10-17 NOTE — Progress Notes (Signed)
 Name: Brenda Ashley Date of visit: 10/17/23  Chief Complaint   Chief Complaint  Patient presents with  . Establish Care    Previous pcp left the practice [facility])    Subjective  Brenda Ashley is a 58 y.o. female who presents today at [facility] to establish care with new provider.   History of Present Illness The patient is a 58 year old female who presents to establish care.  She has a history of PCOS, HPV infection, glaucoma, GERD, COVID-19, and abnormal Pap smear. Her surgical history includes multiple vulvar surgeries, open reduction of LeFort 1 fracture, laser ablation chondromalacia, cervical and lumbar wide local excision, gynecological cryosurgery, breast lumpectomy, and breast biopsy. She is postmenopausal and has been experiencing female problems since the birth of her son. She is sexually active with her husband and does not use any birth control. 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 An IUD device was removed at least 2 years ago.   She continues to experience abdominal pain and flank pain. She has been seeing a GI specialist for her abdominal pain and has undergone several tests, all of which have returned normal results. She is scheduled for a colonoscopy in a month. She wakes up without pain, but it starts later in the day and is consistent. She has not been able to work out as usual due to her pain and has been strength training for 3 years. She does not believe her pain is related to food.  She experiences anxiety, particularly in medical settings or during procedures. She has never been treated for anxiety but has Valium prescribed by her gynecologist for use during procedures. 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  She has a history of melanoma and sees a dermatologist yearly, with her next appointment scheduled for next week.  She has high cholesterol and had blood work done earlier in the year  when she was seeing a hormone specialist, which showed slightly elevated lipoprotein A levels. She is considering seeing a cardiologist due to her family history of heart disease.  She reports no recent illness but mentions a runny nose, which she attributes to allergies.  Marital Status: Married Education Level: Bachelor's degree in Science Hobbies: Strength training Alcohol: Occasional use Tobacco: Former smoker, quit in November 04, 1991 Recreational Drugs: None Sexual Practices: Sexually active with husband, no birth control used  PAST SURGICAL HISTORY: - Multiple vulvar surgeries - Open reduction of LeFort 1 fracture - Laser ablation chondromalacia - Cervical and lumbar wide local excision - Gynecological cryosurgery - Breast lumpectomy - Breast biopsy  FAMILY HISTORY Her mother died of lung cancer in 1998-11-04. Her father had heart disease and died by suicide on 05/18/93. One brother passed from AIDS in 1992/11/03. Another brother died from renal failure due to diabetes in February 2022.   Current Outpatient Medications  Medication Instructions  . cholecalciferol (VITAMIN D3) 5,000 Units, Every morning  . estradioL  (Vivelle -Dot) 0.0375 mg/24 hr 1 patch, transdermal, 2 times weekly - adjustable  . hydrOXYzine pamoate (VISTARIL) 25 mg, oral, 3 times daily PRN  . magnesium 250 mg tablet 1 tablet, Daily  . pantoprazole  (PROTONIX ) 40 mg, Daily  . progesterone  (PROMETRIUM ) 200 mg, oral, Nightly    PAST MEDICAL, SOCIAL & FAMILY HISTORY:   Medical History[1] Surgical History[2] Family History[3] Social History[4]   Allergies: Codeine  IMMUNIZATIONS/ HEALTH STATUS    Immunization History  Administered Date(s) Administered  . Influenza, Injectable,  Quadrivalent, Preservative Free 11/19/2018, 11/13/2020, 03/04/2022  . Influenza, split virus, trivalent, preservative 03/04/2022, 10/20/2022  . Influenza,split virus, trivalent, PF 10/20/2022, 10/17/2023  . Moderna SARS-CoV-2 Primary  Series 12+ yrs 04/22/2019, 05/24/2019, 02/13/2020  . Novel Influenza H1n1-09 02/03/2008  . TDAP VACCINE (BOOSTRIX,ADACEL) 7Y+ 08/03/2011, 11/19/2018  . Varicella Zoster [facility]) 18Y+ 01/07/2019, 06/24/2019     Health Maintenance Status       Date Due Completion Dates   HIV Screening Never done ---   Hepatitis C Screening Never done ---   Diabetes Screening Never done ---   Hepatitis B Vaccines (1 of 3 - 19+ 3-dose series) Never done ---   Pneumococcal Vaccine for Ages 50+ (1 of 1 - PCV) Never done ---   COVID-19 Vaccine (4 - 2025-26 season) 10/16/2023 02/13/2020, 05/24/2019   Comprehensive Annual Visit 07/02/2024 07/03/2023, 05/19/2022   Depression Screening 07/02/2024 07/03/2023, 11/10/2022   Breast Cancer Screening (Mammogram) 08/13/2025 08/14/2023, 08/11/2022   Colorectal Cancer Screening 10/25/2026 11/08/2022, 05/05/2019   Cervical Cancer Screening 07/02/2028 07/03/2023, 07/03/2023   DTaP/Tdap/Td Vaccines (3 - Td or Tdap) 11/18/2028 11/19/2018, 08/03/2011   Adult RSV (60+ Years or Pregnancy) (1 - 1-dose 75+ series) 04/07/2041 ---      Most recent PHQ-2 results: Patient Health Questionnaire-2 Score: 0 (07/03/2023  1:29 PM)  Most recent PHQ-9 result:   PHQ-9 Question # 9   Interpretation: PHQ-2 Interpretation: Negative (None-minimal Depression Severity) (07/03/2023  1:29 PM)   Depression Plan: Normal/Negative Screening   GAD-7 Total Score: 5 (10/17/2023  4:23 PM) Mild anxiety  ROS  Other systems reviewed and negative. No other complaints.  Objective   Vitals:   10/17/23 1553  BP: 131/81  Pulse: 68  SpO2: 100%    Physical Exam: Gen: appears comfortable. HEENT: PERRLA, nares patent, clear oropharynx, neck supple. Mild submental lymphadenopathy noted Cardio: Normal rate, regular rhythm. Normal S1/S2. No appreciable murmurs. Resp: Lungs clear to auscultation bilaterally.  MSK:  No gross abnormality  Skin: Warm and dry.  Neurologic:  No focal deficits. Psych: Answers questions  appropriately, cooperative. Appropriate affect.  ASSESSMENT & PLAN   Problem List Items Addressed This Visit     Anxiety   Relevant Medications   hydrOXYzine pamoate (VISTARIL) 25 mg capsule   Other Visit Diagnoses       Encounter to establish care    -  Primary     Need for influenza vaccination       Relevant Orders   Flu,Trivalent,IM, Preservative Free (Completed)     Vitamin D  deficiency       Relevant Orders   Vitamin D , 25-Hydroxy   Vitamin D , 25-Hydroxy     Screening for lipid disorders       Relevant Orders   Lipid Panel   Lipid Panel     Annual physical exam       Relevant Orders   CBC with Differential   Comprehensive Metabolic Panel   Comprehensive Metabolic Panel   CBC with Differential     Screening for thyroid disorder       Relevant Orders   TSH With Reflex To Free T4   TSH With Reflex To Free T4     Family history of diabetes mellitus in brother       Relevant Orders   Hemoglobin A1C With Estimated Average Glucose   Hemoglobin A1C With Estimated Average Glucose      Orders Placed This Encounter  Procedures  . Flu,Trivalent,IM, Preservative Free  . CBC with Differential  .  Comprehensive Metabolic Panel  . TSH With Reflex To Free T4  . Lipid Panel  . Vitamin D , 25-Hydroxy  . Hemoglobin A1C With Estimated Average Glucose  . Hemoglobin A1C With Estimated Average Glucose  . Vitamin D , 25-Hydroxy  . Lipid Panel  . TSH With Reflex To Free T4  . Comprehensive Metabolic Panel  . CBC with Differential   Orders Placed This Encounter  Medications  . hydrOXYzine pamoate (VISTARIL) 25 mg capsule    Sig: Take 1 capsule (25 mg total) by mouth 3 (three) times a day as needed for anxiety.    Dispense:  90 capsule    Refill:  0   Assessment & Plan 1. Encounter to establish care (Primary) - Reviewed medical, family history, surgeries, allergies, medications.  2. Anxiety - GAD-7 Total Score: 5 (10/17/2023  4:23 PM) - Mild anxiety indicated by her GAD-7  score. - Hydroxyzine 25 mg orally three times a day as needed for anxiety will be prescribed. If the 25 mg dose is not effective, she can increase to two tablets up to 100 mg per day. - ? cause of unexplained abdominal pain - Will continue to follow - hydrOXYzine pamoate (VISTARIL) 25 mg capsule; Take 1 capsule (25 mg total) by mouth 3 (three) times a day as needed for anxiety.  Dispense: 90 capsule; Refill: 0  3. Need for influenza vaccination - Given today - Flu,Trivalent,IM, Preservative Free  4. Vitamin D  deficiency - Hx of Vit D deficiency, takes daily supplement. Will recheck labs and f/u with results - Vitamin D , 25-Hydroxy; Future - Vitamin D , 25-Hydroxy; Future  5. Screening for lipid disorders - Lipid Panel; Future - Lipid Panel; Future  6. Annual physical exam - RTC in 2 weeks for CPE, fasting labs prior - CBC with Differential; Future - Comprehensive Metabolic Panel; Future - Comprehensive Metabolic Panel; Future - CBC with Differential; Future  7. Screening for thyroid disorder - TSH With Reflex To Free T4; Future - TSH With Reflex To Free T4; Future  8. Family history of diabetes mellitus in brother - Hemoglobin A1C With Estimated Average Glucose; Future - Hemoglobin A1C With Estimated Average Glucose; Future  9. Right upper quadrant abdominal pain - Hx of upper right abdominal pain - CT ABD/Pelvis done 10/02/2023 - negative  - Hida Scan 09/13/2023 - normal - US  abdomen 08/09/2023: Normal gallbladder. No acute abnormality noted in the right upper quadrant - US  Renal 08/01/2023: 1. Mild fullness of the right renal pelvis. No caliectasis. 2. No other abnormalities.  - Currently followed by [facility] - Scheduled for procedure (EGD/Colon) later this month. Advised to continue to follow-up with scheduled providers as it will take longer to transfer care and schedule procedures with [facility] providers. - Patient agreeable to this plan   Follow up 2 weeks for  CPE, labs prior,  sooner if needed. Patient verbalized agreement to above plan.  40 minutes was spent reviewing and interpreting prior notes/images/labs, counseling the patient, transmitting prescriptions and arranging follow up.   This note was dictated with voice recognition software. Similar sounding words may be inadvertently transcribed incorrectly. Note partially generated using DAX software. Pt consent obtained prior to use.    Mliss Jenkins Minors, NP        [1] Past Medical History: Diagnosis Date  . Abdominal pannus 10/17/2023  . Abnormal Pap smear of cervix 1997  . 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 COVID    07-09-20  . Current smoker 10/17/2023  .  GERD (gastroesophageal reflux disease) Fall 2024  . Glaucoma 12/2022  . 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  SABRA Human papilloma virus (HPV) infection 10/17/2023  . IUD (intrauterine device) in place 04/26/2017  . Muscle spasm of back 11/19/2018  . Perimenopause 11/24/2020  . Polycystic ovary syndrome 1998  . Vitamin D  deficiency 05/01/2017  . 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 BREAST BIOPSY  11/08/2019  . BREAST LUMPECTOMY  11-08-2019  . FRACTURE SURGERY  05/06/2008  . GYNECOLOGIC CRYOSURGERY  11/07/20  . LASER ABLATION CONDYLOMA CERVICAL / VULVAR  Nov 08, 2011   wide local excision and laser vulva  . OPEN REDUCTION LE FORT I FRACTURE  04/2008  . VULVA SURGERY  11-07-17  [3] Family History Problem Relation Name Age of Onset  . Cancer Mother Rudell Ferrier        passed away 11-08-98  . Heart attack Father Seena Ferrier        passed away 05-22-93 (suicide)  . Heart disease Father Seena Ferrier 59 - 50  . Diabetes Brother Oneil Ferrier        passed away 2020-04-09 from Renal Failure  . Acquired Immunodeficiency Syndrome Brother Camellia Ferrier        passed away 1994 complications from AIDS  . Cancer Maternal Grandmother    . Breast cancer Paternal  Grandmother    [4] Social History Tobacco Use  . Smoking status: Former    Current packs/day: 0.00    Types: Cigarettes    Start date: 39    Quit date: 1993    Years since quitting: 32.6  . Smokeless tobacco: Never  . Tobacco comments:    quit 1993  Vaping Use  . Vaping status: Never Used  Substance Use Topics  . Alcohol use: Not Currently    Alcohol/week: 2.0 standard drinks of alcohol    Types: 2 Cans of beer per week  . Drug use: Never    Comment: Drug use: Denies

## 2023-10-25 ENCOUNTER — Encounter (HOSPITAL_COMMUNITY): Payer: Self-pay | Admitting: Emergency Medicine

## 2023-10-25 ENCOUNTER — Emergency Department (HOSPITAL_COMMUNITY)
Admission: EM | Admit: 2023-10-25 | Discharge: 2023-10-25 | Disposition: A | Discharge status: Home / Self Care | Attending: Emergency Medicine | Admitting: Emergency Medicine

## 2023-10-25 ENCOUNTER — Other Ambulatory Visit: Payer: Self-pay

## 2023-10-25 DIAGNOSIS — R1031 Right lower quadrant pain: Secondary | ICD-10-CM | POA: Insufficient documentation

## 2023-10-25 DIAGNOSIS — R1011 Right upper quadrant pain: Secondary | ICD-10-CM | POA: Insufficient documentation

## 2023-10-25 DIAGNOSIS — R1032 Left lower quadrant pain: Secondary | ICD-10-CM | POA: Diagnosis not present

## 2023-10-25 DIAGNOSIS — Z85828 Personal history of other malignant neoplasm of skin: Secondary | ICD-10-CM | POA: Diagnosis not present

## 2023-10-25 DIAGNOSIS — Z85820 Personal history of malignant melanoma of skin: Secondary | ICD-10-CM | POA: Diagnosis not present

## 2023-10-25 DIAGNOSIS — R109 Unspecified abdominal pain: Secondary | ICD-10-CM | POA: Diagnosis not present

## 2023-10-25 DIAGNOSIS — L821 Other seborrheic keratosis: Secondary | ICD-10-CM | POA: Diagnosis not present

## 2023-10-25 DIAGNOSIS — D2261 Melanocytic nevi of right upper limb, including shoulder: Secondary | ICD-10-CM | POA: Diagnosis not present

## 2023-10-25 LAB — COMPREHENSIVE METABOLIC PANEL WITH GFR
ALT: 18 U/L (ref 0–44)
AST: 20 U/L (ref 15–41)
Albumin: 4.5 g/dL (ref 3.5–5.0)
Alkaline Phosphatase: 54 U/L (ref 38–126)
Anion gap: 13 (ref 5–15)
BUN: 17 mg/dL (ref 6–20)
CO2: 24 mmol/L (ref 22–32)
Calcium: 9.6 mg/dL (ref 8.9–10.3)
Chloride: 104 mmol/L (ref 98–111)
Creatinine, Ser: 0.78 mg/dL (ref 0.44–1.00)
GFR, Estimated: >60 mL/min (ref >60)
Glucose, Bld: 103 mg/dL — ABNORMAL HIGH (ref 70–99)
Potassium: 4.4 mmol/L (ref 3.5–5.1)
Sodium: 141 mmol/L (ref 135–145)
Total Bilirubin: 0.7 mg/dL (ref 0.0–1.2)
Total Protein: 7.2 g/dL (ref 6.5–8.1)

## 2023-10-25 LAB — CBC
HCT: 43.6 % (ref 36.0–46.0)
Hemoglobin: 14.8 g/dL (ref 12.0–15.0)
MCH: 33 pg (ref 26.0–34.0)
MCHC: 33.9 g/dL (ref 30.0–36.0)
MCV: 97.1 fL (ref 80.0–100.0)
Platelets: 243 K/uL (ref 150–400)
RBC: 4.49 MIL/uL (ref 3.87–5.11)
RDW: 11.5 % (ref 11.5–15.5)
WBC: 6.3 K/uL (ref 4.0–10.5)
nRBC: 0 % (ref 0.0–0.2)

## 2023-10-25 LAB — URINALYSIS, ROUTINE W REFLEX MICROSCOPIC
Bilirubin Urine: NEGATIVE
Glucose, UA: NEGATIVE mg/dL
Hgb urine dipstick: NEGATIVE
Ketones, ur: NEGATIVE mg/dL
Leukocytes,Ua: NEGATIVE
Nitrite: NEGATIVE
Protein, ur: NEGATIVE mg/dL
Specific Gravity, Urine: 1.012 (ref 1.005–1.030)
pH: 6 (ref 5.0–8.0)

## 2023-10-25 LAB — LIPASE, BLOOD: Lipase: 53 U/L — ABNORMAL HIGH (ref 11–51)

## 2023-10-25 NOTE — ED Triage Notes (Signed)
 Pt reports abdominal pain since June.  Pt has seen GI and had US  of her gallbladder and also had CT scan which was negative.  Pt reports constant pain about a 4/10.  Pt states she has a colonoscopy scheduled.   Feels like she wants an answer to what is causing her pain.

## 2023-10-25 NOTE — ED Provider Notes (Signed)
 [place] EMERGENCY DEPARTMENT AT [facility] Provider Note   CSN: [id number] Arrival date & time: 10/25/23  1438     Patient presents with: Abdominal Pain   Brenda Ashley is a 58 y.o. female.   The history is provided by the patient and medical records. No language interpreter was used.  Abdominal Pain    58 year old female who is currently on hormone replacement therapy due to perimenopause presenting with complaint of abdominal pain.  Patient states since June of this year she has had persistent pain primarily to the right side of abdomen. Pain is constant, rates at 4/10.  Pain is not brought on by eating, there is no associated fever chills no nausea vomiting diarrhea no dysuria no chest pain or trouble breathing and no rash.  She reports she has been evaluated for this with both ultrasound and CT scan without any definitive diagnosis.  She does have a colonoscopy scheduled in the near future.  Patient states despite knowing that she has a normal gallbladder ultrasound normal CT scan and normal HIDA scan recently she has to wait for several weeks before she we will have her endoscopy and colonoscopy and she voiced concern.  States she should not have discomfort to the right side of her abdomen for such a long period of time.  Patient states she normally exercises regularly but did stop exercising for the past month but noticed no improvement of her symptoms.  She does not complain of any fever or chills no chest pain shortness of breath coughing dysuria hematuria or noticing any rash.  Her kid is getting married in 2 weeks and she wants to make sure she is doing well prior to the ceremony.   Prior to Admission medications   Medication Sig Start Date End Date Taking? Authorizing Provider  Cholecalciferol (VITAMIN D3) 5000 units TABS 5,000 IU OTC vitamin D3 daily. 05/01/17   Opalski, Barnie, DO  Estradiol  0.52 MG/0.87 GM (0.06%) GEL Apply 1 Application topically daily. 07/04/23    [provider]  hyoscyamine  (LEVSIN  SL) 0.125 MG SL tablet Place 1 tablet (0.125 mg total) under the tongue every 8 (eight) hours as needed. 09/27/23   Heinz, Sara E, PA-C  ketorolac (ACULAR) 0.5 % ophthalmic solution INSTILL 1 DROP IN AFFECTED EYE(S) 3-4 TIMES DAILY FOR 10 DAYS 07/13/23   [provider]  latanoprost (XALATAN) 0.005 % ophthalmic solution Place 1 drop into both eyes at bedtime. 06/27/23   [provider]  lidocaine -prilocaine  (EMLA ) cream Apply 1 Application topically as needed. 09/12/23   Heinz, Sara E, PA-C  MAGNESIUM PO Take by mouth.    [provider]  Multiple Vitamin (MULTIVITAMIN) capsule Take 1 capsule by mouth daily.    [provider]  pantoprazole  (PROTONIX ) 40 MG tablet Take 1 tablet (40 mg total) by mouth daily. 08/23/23   Heinz, Camie BRAVO, PA-C  progesterone  (PROMETRIUM ) 200 MG capsule Take 200 mg by mouth daily. 07/03/23   [provider]    Allergies: Codeine    Review of Systems  Gastrointestinal:  Positive for abdominal pain.  All other systems reviewed and are negative.   Updated Vital Signs BP 127/76 (BP Location: Right Arm)   Pulse 82   Temp 98.1 F (36.7 C) (Oral)   Resp 16   SpO2 99%   Physical Exam Vitals and nursing note reviewed.  Constitutional:      General: She is not in acute distress.    Appearance: She is well-developed.  HENT:  Head: Atraumatic.  Eyes:     Conjunctiva/sclera: Conjunctivae normal.  Cardiovascular:     Rate and Rhythm: Normal rate and regular rhythm.  Pulmonary:     Effort: Pulmonary effort is normal.  Abdominal:     Palpations: Abdomen is soft.     Tenderness: There is abdominal tenderness (Mild right upper quadrant tenderness, right lower quadrant tenderness, and left lower quadrant tenderness on palpation without any guarding or rebound tenderness.  No overlying skin changes.).     Hernia: No hernia is present.  Musculoskeletal:     Cervical back: Neck  supple.  Skin:    Findings: No rash.  Neurological:     Mental Status: She is alert.  Psychiatric:        Mood and Affect: Mood normal.     (all labs ordered are listed, but only abnormal results are displayed) Labs Reviewed  LIPASE, BLOOD - Abnormal; Notable for the following components:      Result Value   Lipase 53 (*)    All other components within normal limits  COMPREHENSIVE METABOLIC PANEL WITH GFR - Abnormal; Notable for the following components:   Glucose, Bld 103 (*)    All other components within normal limits  URINALYSIS, ROUTINE W REFLEX MICROSCOPIC - Abnormal; Notable for the following components:   Color, Urine STRAW (*)    All other components within normal limits  CBC    EKG: None  Radiology: No results found.   Procedures   Medications Ordered in the ED - No data to display                                  Medical Decision Making Amount and/or Complexity of Data Reviewed Labs: ordered.   BP 127/76 (BP Location: Right Arm)   Pulse 82   Temp 98.1 F (36.7 C) (Oral)   Resp 16   SpO2 99%   3:3 PM  59 year old female who is currently on hormone replacement therapy due to perimenopause presenting with complaint of abdominal pain.  Patient states since June of this year she has had persistent pain primarily to the right side of abdomen. Pain is constant, rates at 4/10.  Pain is not brought on by eating, there is no associated fever chills no nausea vomiting diarrhea no dysuria no chest pain or trouble breathing and no rash.  She reports she has been evaluated for this with both ultrasound and CT scan without any definitive diagnosis.  She does have a colonoscopy scheduled in the near future.  Patient states despite knowing that she has a normal gallbladder ultrasound normal CT scan and normal HIDA scan recently she has to wait for several weeks before she we will have her endoscopy and colonoscopy and she voiced concern.  States she should not have  discomfort to the right side of her abdomen for such a long period of time.  Patient states she normally exercises regularly but did stop exercising for the past month but noticed no improvement of her symptoms.  She does not complain of any fever or chills no chest pain shortness of breath coughing dysuria hematuria or noticing any rash.  Her kid is getting married in 2 weeks and she wants to make sure she is doing well prior to the ceremony.   Exam notable for mild tenderness to right upper quadrant right lower quadrant and left lower quadrant on palpation but no guarding no  rebound tenderness no concerning rash and no CVA tenderness.  -Labs ordered, independently viewed and interpreted by me.  Labs remarkable for reassuring values -The patient was maintained on a cardiac monitor.  I personally viewed and interpreted the cardiac monitored which showed an underlying rhythm of: NSR -Imaging including repeat abd/pelvis CT considered but exam fairly benign and labs are reassuring.  Doubt acute emergent abdominal pathology -This patient presents to the ED for concern of abd pain, this involves an extensive number of treatment options, and is a complaint that carries with it a high risk of complications and morbidity.  The differential diagnosis includes cholecystitis, colitis, pyelonephritis, shingles, pna, msk, appendicitis -Co morbidities that complicate the patient evaluation includes HLD -Treatment includes observation -Reevaluation of the patient after these medicines showed that the patient stayed the same -PCP office notes or outside notes reviewed -Escalation to admission/observation considered: patients feels ok, is comfortable with discharge, and will follow up with PCP -Prescription medication considered, patient comfortable with home medication -Social Determinant of Health considered which includes remote tobacco use.       Final diagnoses:  Right upper quadrant abdominal pain    ED  Discharge Orders     None          Nivia Colon, PA-C 10/25/23 1709    Pamella Ozell LABOR, DO 11/02/23 [phone]

## 2023-10-25 NOTE — Discharge Instructions (Signed)
 Fortunately blood work today did not show any concerning finding.  Please follow-up closely with your doctor for further care.

## 2023-10-25 NOTE — ED Provider Triage Note (Signed)
 Emergency Medicine Provider Triage Evaluation Note  Brenda Ashley , a 58 y.o. female  was evaluated in triage.  Pt complains of abd pain. R side abd pain since June of this year.  No fever, chills, n/v/d, dysuria, cp, sob, post prandial pain.  Has had CT and US  without definitive diagnosis  Review of Systems  Positive: As above Negative: As above  Physical Exam  BP 127/76 (BP Location: Right Arm)   Pulse 82   Temp 98.1 F (36.7 C) (Oral)   Resp 16   SpO2 99%  Gen:   Awake, no distress   Resp:  Normal effort  MSK:   Moves extremities without difficulty  Other:    Medical Decision Making  Medically screening exam initiated at 3:16 PM.  Appropriate orders placed.  Camary Sosa was informed that the remainder of the evaluation will be completed by another provider, this initial triage assessment does not replace that evaluation, and the importance of remaining in the ED until their evaluation is complete.     Nivia Colon, PA-C 10/25/23 1517

## 2023-10-27 DIAGNOSIS — R1011 Right upper quadrant pain: Secondary | ICD-10-CM | POA: Diagnosis not present

## 2023-10-31 DIAGNOSIS — Z833 Family history of diabetes mellitus: Secondary | ICD-10-CM | POA: Diagnosis not present

## 2023-10-31 DIAGNOSIS — Z0000 Encounter for general adult medical examination without abnormal findings: Secondary | ICD-10-CM | POA: Diagnosis not present

## 2023-10-31 DIAGNOSIS — Z13220 Encounter for screening for lipoid disorders: Secondary | ICD-10-CM | POA: Diagnosis not present

## 2023-10-31 DIAGNOSIS — Z1329 Encounter for screening for other suspected endocrine disorder: Secondary | ICD-10-CM | POA: Diagnosis not present

## 2023-11-03 ENCOUNTER — Ambulatory Visit
Admission: RE | Admit: 2023-11-03 | Discharge: 2023-11-03 | Disposition: A | Discharge status: Home / Self Care | Source: Ambulatory Visit

## 2023-11-03 DIAGNOSIS — E785 Hyperlipidemia, unspecified: Secondary | ICD-10-CM

## 2023-11-07 DIAGNOSIS — Z0000 Encounter for general adult medical examination without abnormal findings: Secondary | ICD-10-CM | POA: Diagnosis not present

## 2023-11-08 ENCOUNTER — Encounter: Admitting: Gastroenterology

## 2023-11-10 ENCOUNTER — Ambulatory Visit: Admitting: Gastroenterology

## 2023-11-14 ENCOUNTER — Encounter: Payer: Self-pay | Admitting: Gastroenterology

## 2023-11-14 ENCOUNTER — Ambulatory Visit (AMBULATORY_SURGERY_CENTER): Admitting: Gastroenterology

## 2023-11-14 VITALS — BP 112/67 | HR 74 | Temp 98.1°F | Resp 16 | Ht 61.0 in | Wt 117.0 lb

## 2023-11-14 DIAGNOSIS — R1011 Right upper quadrant pain: Secondary | ICD-10-CM

## 2023-11-14 DIAGNOSIS — R1084 Generalized abdominal pain: Secondary | ICD-10-CM | POA: Diagnosis not present

## 2023-11-14 DIAGNOSIS — K449 Diaphragmatic hernia without obstruction or gangrene: Secondary | ICD-10-CM | POA: Diagnosis not present

## 2023-11-14 DIAGNOSIS — K648 Other hemorrhoids: Secondary | ICD-10-CM | POA: Diagnosis not present

## 2023-11-14 DIAGNOSIS — K644 Residual hemorrhoidal skin tags: Secondary | ICD-10-CM | POA: Diagnosis not present

## 2023-11-14 DIAGNOSIS — D123 Benign neoplasm of transverse colon: Secondary | ICD-10-CM

## 2023-11-14 DIAGNOSIS — K635 Polyp of colon: Secondary | ICD-10-CM

## 2023-11-14 DIAGNOSIS — K219 Gastro-esophageal reflux disease without esophagitis: Secondary | ICD-10-CM

## 2023-11-14 DIAGNOSIS — Z1211 Encounter for screening for malignant neoplasm of colon: Secondary | ICD-10-CM | POA: Diagnosis not present

## 2023-11-14 DIAGNOSIS — D122 Benign neoplasm of ascending colon: Secondary | ICD-10-CM

## 2023-11-14 MED ORDER — SODIUM CHLORIDE 0.9 % IV SOLN: 500.0000 mL | Freq: Once | INTRAVENOUS | Status: DC

## 2023-11-14 NOTE — Progress Notes (Signed)
 To pacu, VSS. Report to [email or web address]

## 2023-11-14 NOTE — Op Note (Signed)
 [facility] Patient Name: Brenda Ashley Procedure Date: 11/14/2023 2:38 PM MRN: [id number] Endoscopist: Gustav ALONSO Mcgee , MD, [id number] Age: 58 Referring MD:  Date of Birth: [birth year] Gender: Female Account #: [id number] Procedure:                Upper GI endoscopy Indications:              Upper abdominal pain, Generalized abdominal pain Medicines:                Monitored Anesthesia Care Procedure:                Pre-Anesthesia Assessment:                           - Prior to the procedure, a History and Physical                            was performed, and patient medications and                            allergies were reviewed. The patient's tolerance of                            previous anesthesia was also reviewed. The risks                            and benefits of the procedure and the sedation                            options and risks were discussed with the patient.                            All questions were answered, and informed consent                            was obtained. Prior Anticoagulants: The patient has                            taken no anticoagulant or antiplatelet agents. ASA                            Grade Assessment: II - A patient with mild systemic                            disease. After reviewing the risks and benefits,                            the patient was deemed in satisfactory condition to                            undergo the procedure.                           After obtaining informed consent, the endoscope was  passed under direct vision. Throughout the                            procedure, the patient's blood pressure, pulse, and                            oxygen saturations were monitored continuously. The                            Olympus Scope [id number] was introduced through the                            mouth, and advanced to the second part of duodenum.                             The upper GI endoscopy was accomplished without                            difficulty. The patient tolerated the procedure                            well. Scope In: Scope Out: Findings:                 The Z-line was regular and was found 48 cm from the                            incisors.                           No gross lesions were noted in the entire esophagus.                           A 3 cm hiatal hernia was present.                           The stomach was normal.                           The cardia and gastric fundus were normal on                            retroflexion.                           The examined duodenum was normal. Complications:            No immediate complications. Estimated Blood Loss:     Estimated blood loss was minimal. Impression:               - Z-line regular, 48 cm from the incisors.                           - No gross lesions in the entire esophagus.                           -  3 cm hiatal hernia.                           - Normal stomach.                           - Normal examined duodenum.                           - No specimens collected. Recommendation:           - Resume previous diet.                           - Continue present medications.                           - Follow an antireflux regimen.                           - Continue Protonix  (pantoprazole ) 40 mg PO daily. Ansh Fauble V. Tadhg Eskew, MD 11/14/2023 3:28:15 PM This report has been signed electronically.

## 2023-11-14 NOTE — Progress Notes (Signed)
 Pt's states no medical or surgical changes since previsit or office visit.

## 2023-11-14 NOTE — Progress Notes (Signed)
 Called to room to assist during endoscopic procedure.  Patient ID and intended procedure confirmed with present staff. Received instructions for my participation in the procedure from the performing physician.

## 2023-11-14 NOTE — Op Note (Signed)
 [facility] Patient Name: Brenda Ashley Procedure Date: 11/14/2023 2:37 PM MRN: [id number] Endoscopist: Gustav ALONSO Mcgee , MD, [id number] Age: 58 Referring MD:  Date of Birth: 13-Dec-1965 Gender: Female Account #: [id number] Procedure:                Colonoscopy Indications:              Generalized abdominal pain Medicines:                Monitored Anesthesia Care Procedure:                Pre-Anesthesia Assessment:                           - Prior to the procedure, a History and Physical                            was performed, and patient medications and                            allergies were reviewed. The patient's tolerance of                            previous anesthesia was also reviewed. The risks                            and benefits of the procedure and the sedation                            options and risks were discussed with the patient.                            All questions were answered, and informed consent                            was obtained. Prior Anticoagulants: The patient has                            taken no anticoagulant or antiplatelet agents. ASA                            Grade Assessment: II - A patient with mild systemic                            disease. After reviewing the risks and benefits,                            the patient was deemed in satisfactory condition to                            undergo the procedure.                           After obtaining informed consent, the colonoscope  was passed under direct vision. Throughout the                            procedure, the patient's blood pressure, pulse, and                            oxygen saturations were monitored continuously. The                            Olympus Scope [id number] was introduced through the                            anus and advanced to the the cecum, identified by                            appendiceal orifice and  ileocecal valve. The                            colonoscopy was performed without difficulty. The                            patient tolerated the procedure well. The quality                            of the bowel preparation was good. The ileocecal                            valve, appendiceal orifice, and rectum were                            photographed. Scope In: 2:53:22 PM Scope Out: 3:08:51 PM Scope Withdrawal Time: 0 hours 10 minutes 45 seconds  Total Procedure Duration: 0 hours 15 minutes 29 seconds  Findings:                 The perianal and digital rectal examinations were                            normal.                           Two sessile polyps were found in the transverse                            colon and ascending colon. The polyps were 4 to 11                            mm in size. These polyps were removed with a cold                            snare. Resection and retrieval were complete.                           Non-bleeding external and internal hemorrhoids were  found during retroflexion. The hemorrhoids were                            medium-sized. Complications:            No immediate complications. Estimated Blood Loss:     Estimated blood loss was minimal. Impression:               - Two 4 to 11 mm polyps in the transverse colon and                            in the ascending colon, removed with a cold snare.                            Resected and retrieved.                           - Non-bleeding external and internal hemorrhoids. Recommendation:           - Patient has a contact number available for                            emergencies. The signs and symptoms of potential                            delayed complications were discussed with the                            patient. Return to normal activities tomorrow.                            Written discharge instructions were provided to the                             patient.                           - Resume previous diet.                           - Continue present medications.                           - Await pathology results.                           - Repeat colonoscopy in 3 years for surveillance                            based on pathology results. Lillian Ballester V. Rhylen Pulido, MD 11/14/2023 3:25:42 PM This report has been signed electronically.

## 2023-11-14 NOTE — Patient Instructions (Addendum)
 Resume previous diet. Follow antireflux regimen. Continue present medications. Continue Protonix  (pantoprazole ) 40mg  PO daily.  Await pathology results Repeat colonoscopy in 3 years See handout on polyps and hiatal hernia YOU HAD AN ENDOSCOPIC PROCEDURE TODAY AT THE [facility]:   Refer to the procedure report that was given to you for any specific questions about what was found during the examination.  If the procedure report does not answer your questions, please call your gastroenterologist to clarify.  If you requested that your care partner not be given the details of your procedure findings, then the procedure report has been included in a sealed envelope for you to review at your convenience later.  YOU SHOULD EXPECT: Some feelings of bloating in the abdomen. Passage of more gas than usual.  Walking can help get rid of the air that was put into your GI tract during the procedure and reduce the bloating. If you had a lower endoscopy (such as a colonoscopy or flexible sigmoidoscopy) you may notice spotting of blood in your stool or on the toilet paper. If you underwent a bowel prep for your procedure, you may not have a normal bowel movement for a few days.  Please Note:  You might notice some irritation and congestion in your nose or some drainage.  This is from the oxygen used during your procedure.  There is no need for concern and it should clear up in a day or so.  SYMPTOMS TO REPORT IMMEDIATELY: Following lower endoscopy (colonoscopy or flexible sigmoidoscopy):  Excessive amounts of blood in the stool  Significant tenderness or worsening of abdominal pains  Swelling of the abdomen that is new, acute  Fever of 100F or higher  Following upper endoscopy (EGD)  Vomiting of blood or coffee ground material  New chest pain or pain under the shoulder blades  Painful or persistently difficult swallowing  New shortness of breath  Black, tarry-looking stools  For urgent or  emergent issues, a gastroenterologist can be reached at any hour by calling [phone]. Do not use MyChart messaging for urgent concerns.   DIET:  We do recommend a small meal at first, but then you may proceed to your regular diet.  Drink plenty of fluids but you should avoid alcoholic beverages for 24 hours.  ACTIVITY:  You should plan to take it easy for the rest of today and you should NOT DRIVE or use heavy machinery until tomorrow (because of the sedation medicines used during the test).    FOLLOW UP: Our staff will call the number listed on your records the next business day following your procedure.  We will call around 7:15- 8:00 am to check on you and address any questions or concerns that you may have regarding the information given to you following your procedure. If we do not reach you, we will leave a message.     If any biopsies were taken you will be contacted by phone or by letter within the next 1-3 weeks.  Please call us  at [phone] if you have not heard about the biopsies in 3 weeks.   SIGNATURES/CONFIDENTIALITY: You and/or your care partner have signed paperwork which will be entered into your electronic medical record.  These signatures attest to the fact that that the information above on your After Visit Summary has been reviewed and is understood.  Full responsibility of the confidentiality of this discharge information lies with you and/or your care-partner.

## 2023-11-14 NOTE — Progress Notes (Signed)
 [facility] History and Physical   Primary Care Physician:  Armand Sonny HERO, MD   Reason for Procedure:  Generalized abdominal pain  Plan:    EGD and colonoscopy with possible interventions as needed     HPI: Brenda Ashley is a very pleasant 58 y.o. female here for EGD and colonoscopy for generalized abdominal pain. Please refer to office visit note by Camie Furbish for details  The risks and benefits as well as alternatives of endoscopic procedure(s) have been discussed and reviewed. All questions answered. The patient agrees to proceed.    Past Medical History:  Diagnosis Date   Complication of anesthesia    Glaucoma    HLD (hyperlipidemia)    PONV (postoperative nausea and vomiting)    Recurrent upper respiratory infection (URI)     Past Surgical History:  Procedure Laterality Date   BREAST BIOPSY Right    BREAST LUMPECTOMY WITH RADIOACTIVE SEED LOCALIZATION Right 05/30/2019   Procedure: RIGHT BREAST LUMPECTOMY WITH RADIOACTIVE SEED LOCALIZATION;  Surgeon: Vanderbilt Ned, MD;  Location: [facility];  Service: General;  Laterality: Right;   VULVA SURGERY  2019   wide excisional biopsy   WRIST FRACTURE SURGERY Left 2010    Prior to Admission medications   Medication Sig Start Date End Date Taking? Authorizing Provider  gabapentin (NEURONTIN) 100 MG capsule Take 100 mg by mouth. 10/27/23 11/26/23 Yes [provider]  ketorolac (ACULAR) 0.5 % ophthalmic solution INSTILL 1 DROP IN AFFECTED EYE(S) 3-4 TIMES DAILY FOR 10 DAYS 07/13/23  Yes [provider]  latanoprost (XALATAN) 0.005 % ophthalmic solution Place 1 drop into both eyes at bedtime. 06/27/23  Yes [provider]  MAGNESIUM PO Take by mouth.   Yes [provider]  pantoprazole  (PROTONIX ) 40 MG tablet Take 1 tablet (40 mg total) by mouth daily. 08/23/23  Yes Furbish Camie BRAVO, PA-C  progesterone  (PROMETRIUM ) 200 MG capsule Take 200 mg by mouth daily.  07/03/23  Yes [provider]  Cholecalciferol (VITAMIN D3) 5000 units TABS 5,000 IU OTC vitamin D3 daily. 05/01/17   Opalski, Barnie, DO  Estradiol  0.52 MG/0.87 GM (0.06%) GEL Apply 1 Application topically daily. 07/04/23   [provider]  hyoscyamine  (LEVSIN  SL) 0.125 MG SL tablet Place 1 tablet (0.125 mg total) under the tongue every 8 (eight) hours as needed. 09/27/23   Heinz, Sara E, PA-C  lidocaine -prilocaine  (EMLA ) cream Apply 1 Application topically as needed. 09/12/23   Heinz, Sara E, PA-C  Multiple Vitamin (MULTIVITAMIN) capsule Take 1 capsule by mouth daily.    [provider]    Current Outpatient Medications  Medication Sig Dispense Refill   gabapentin (NEURONTIN) 100 MG capsule Take 100 mg by mouth.     ketorolac (ACULAR) 0.5 % ophthalmic solution INSTILL 1 DROP IN AFFECTED EYE(S) 3-4 TIMES DAILY FOR 10 DAYS     latanoprost (XALATAN) 0.005 % ophthalmic solution Place 1 drop into both eyes at bedtime.     MAGNESIUM PO Take by mouth.     pantoprazole  (PROTONIX ) 40 MG tablet Take 1 tablet (40 mg total) by mouth daily. 30 tablet 3   progesterone  (PROMETRIUM ) 200 MG capsule Take 200 mg by mouth daily.     Cholecalciferol (VITAMIN D3) 5000 units TABS 5,000 IU OTC vitamin D3 daily. 90 tablet 3   Estradiol  0.52 MG/0.87 GM (0.06%) GEL Apply 1 Application topically daily.     hyoscyamine  (LEVSIN  SL) 0.125 MG SL tablet Place 1 tablet (0.125 mg total) under the tongue  every 8 (eight) hours as needed. 60 tablet 1   lidocaine -prilocaine  (EMLA ) cream Apply 1 Application topically as needed. 30 g 0   Multiple Vitamin (MULTIVITAMIN) capsule Take 1 capsule by mouth daily.     Current Facility-Administered Medications  Medication Dose Route Frequency Provider Last Rate Last Admin   0.9 %  sodium chloride  infusion  500 mL Intravenous Once Ahtziry Saathoff V, MD        Allergies as of 11/14/2023 - Review Complete 11/14/2023  Allergen Reaction Noted   Codeine Nausea And  Vomiting 01/07/2019    Family History  Problem Relation Age of Onset   Lung cancer Mother    Endometrial cancer Mother    Allergic rhinitis Father    Heart attack Father    Depression Father    Hyperlipidemia Father    Allergic rhinitis Brother    Diabetes Brother    Hyperlipidemia Brother    HIV/AIDS Brother    Breast cancer Maternal Aunt    Breast cancer Cousin    Asthma Son    Allergies Son    Eczema Neg Hx    Urticaria Neg Hx     Social History   Socioeconomic History   Marital status: Married    Spouse name: Not on file   Number of children: 2   Years of education: Not on file   Highest education level: Not on file  Occupational History   Occupation: Admin Assistant/contract work  Tobacco Use   Smoking status: Former    Current packs/day: 0.00    Average packs/day: 0.5 packs/day for 15.0 years (7.5 ttl pk-yrs)    Types: Cigarettes    Start date: 23    Quit date: 1993    Years since quitting: 32.7    Passive exposure: Never   Smokeless tobacco: Never  Vaping Use   Vaping status: Never Used  Substance and Sexual Activity   Alcohol use: Yes    Comment: 2-3 a month   Drug use: No   Sexual activity: Yes    Partners: Male    Birth control/protection: I.U.D.  Other Topics Concern   Not on file  Social History Narrative   Not on file   Social Drivers of Health   Financial Resource Strain: Not on file  Food Insecurity: Low Risk  (10/10/2023)   Received from [facility]   Hunger Vital Sign    Within the past 12 months, you worried that your food would run out before you got money to buy more: Never true    Within the past 12 months, the food you bought just didn't last and you didn't have money to get more. : Never true  Transportation Needs: No Transportation Needs (10/10/2023)   Received from [facility]    In the past 12 months, has lack of reliable transportation kept you from medical appointments, meetings, work or from getting  things needed for daily living? : No  Physical Activity: Not on file  Stress: Not on file  Social Connections: Not on file  Intimate Partner Violence: Not on file    Review of Systems:  All other review of systems negative except as mentioned in the HPI.  Physical Exam: Vital signs in last 24 hours: BP (!) 148/91   Pulse 78   Temp 98.1 F (36.7 C)   Ht 5' 1 (1.549 m)   Wt 117 lb (53.1 kg)   SpO2 98%   BMI 22.11 kg/m  General:  Alert, NAD Lungs:  Clear .   Heart:  Regular rate and rhythm Abdomen:  Soft, nontender and nondistended. Neuro/Psych:  Alert and cooperative. Normal mood and affect. A and O x 3  Reviewed labs, radiology imaging, old records and pertinent past GI work up  Patient is appropriate for planned procedure(s) and anesthesia in an ambulatory setting   K. Veena Aldyn Toon , MD [phone]

## 2023-11-15 ENCOUNTER — Telehealth: Payer: Self-pay | Admitting: *Deleted

## 2023-11-15 NOTE — Telephone Encounter (Signed)
  Follow up Call-     11/14/2023    1:38 PM  Call back number  Post procedure Call Back phone  # [phone]  Permission to leave phone message Yes     Patient questions:  Do you have a fever, pain , or abdominal swelling? No. Pain Score  0 *  Have you tolerated food without any problems? Yes.    Have you been able to return to your normal activities? Yes.    Do you have any questions about your discharge instructions: Diet   No. Medications  No. Follow up visit  No.  Do you have questions or concerns about your Care? No.  Actions: * If pain score is 4 or above: No action needed, pain <4.

## 2023-11-16 NOTE — Progress Notes (Signed)
 Beila Duffner [id number] 1966/02/03   Chief Complaint: Abdominal pain  Referring Provider: Armand Raring * Primary GI MD: Dr. Shila  HPI: Jude Payes is a 58 y.o. female with past medical history of HLD, colon polyps who presents today for follow up.   Seen by PCP 10/20/2022 for right-sided abdominal pain.  Normal CBC, CMP, TSH, negative Hep C.   RUQ US  08/09/2023 showed a normal gallbladder, no acute abnormality in the right upper quadrant.   Negative Cologuard 11/08/2022.   Labs 06/2023 with unremarkable CBC, CMP, TSH, vitamin B12, vitamin D  (viewed on patient's phone, outside records)   Seen in office 08/23/2023 with complaint of 1 month right upper quadrant abdominal pain.  Has had a normal RUQ ultrasound.  Endorsed similar pain in the past which was attributed to musculoskeletal pain.  She is very active and does weight training and regular walking, but had taken a break from weight training to see if it would improve her pain and it did not.  On exam she was tender to palpation of the RUQ as well as right lower ribs.  Worsening pain after meals.  Also endorsed some vague upper GI symptoms such as epigastric discomfort and dyspepsia on Nexium 20 mg which she took for couple weeks and then discontinued.   She was started on pantoprazole  40 mg daily for 6 weeks. HIDA scan was ordered for further evaluation of the gallbladder and was negative. Negative for H. pylori. Normal pelvic US .  At last visit 09/27/2023 patient reported continued intermittent RUQ abdominal pain and was tender to palpation on exam.  No clear inciting factors other than that bending forward caused pain. Recommended trial of lidocaine  patches for possible musculoskeletal pain as well as trial of hyoscyamine .  CT A/P was normal with no concerning findings.  Patient stated hyoscyamine  was not helpful.  Heating pad was helpful but was concerned about continued pain so EGD/colonoscopy was scheduled.  She  underwent EGD/colonoscopy 11/14/2023.  On EGD found to have a 3 cm hiatal hernia and otherwise normal.  On colonoscopy found to have two 4 to 11 mm polyps as well as internal and external hemorrhoids, with recommended recall in 3 years.  Awaiting pathology results.   Patient states she is overall feeling much better.  Has been having fewer stomach aches.  Has not had a bowel movement yet since colonoscopy, but otherwise denies any altered bowel habits.  No blood in stool or melena.  States she had a protein shake and noticed some abdominal discomfort.  Now wondering if whey powder might be causing some of her issues.  Plans to follow low FODMAP diet.    Gets occasional random discomfort in epigastrium.  Also continues to have right flank pain but has noticed that applying heating pad helps a lot with this. Has also noticed that Gas-X helps.  Previous GI Procedures/Imaging   Colonoscopy 11/14/2023 - Two 4 to 11 mm polyps in the transverse colon and in the ascending colon, removed with a cold snare. Resected and retrieved.  - Non- bleeding external and internal hemorrhoids. - Recall 3 years  EGD 11/14/2023 - Z- line regular, 48 cm from the incisors.  - No gross lesions in the entire esophagus.  - 3 cm hiatal hernia.  - Normal stomach.  - Normal examined duodenum.  - No specimens collected.  CT A/P 10/02/2023 Negative. No acute findings or other significant abnormality.   HIDA scan 09/13/2023 Normal biliary patency study and normal gallbladder ejection fraction.  RUQ US  08/09/2023 Normal gallbladder. No acute abnormality noted in the right upper quadrant.  Past Medical History:  Diagnosis Date   Complication of anesthesia    Glaucoma    HLD (hyperlipidemia)    PONV (postoperative nausea and vomiting)    Recurrent upper respiratory infection (URI)     Past Surgical History:  Procedure Laterality Date   BREAST BIOPSY Right    BREAST LUMPECTOMY WITH RADIOACTIVE SEED LOCALIZATION  Right 05/30/2019   Procedure: RIGHT BREAST LUMPECTOMY WITH RADIOACTIVE SEED LOCALIZATION;  Surgeon: Vanderbilt Ned, MD;  Location: [facility];  Service: General;  Laterality: Right;   VULVA SURGERY  2019   wide excisional biopsy   WRIST FRACTURE SURGERY Left 2010    Current Outpatient Medications  Medication Sig Dispense Refill   Cholecalciferol (VITAMIN D3) 5000 units TABS 5,000 IU OTC vitamin D3 daily. 90 tablet 3   gabapentin (NEURONTIN) 100 MG capsule Take 100 mg by mouth.     hyoscyamine  (LEVSIN  SL) 0.125 MG SL tablet Place 1 tablet (0.125 mg total) under the tongue every 8 (eight) hours as needed. 60 tablet 1   latanoprost (XALATAN) 0.005 % ophthalmic solution Place 1 drop into both eyes at bedtime.     lidocaine -prilocaine  (EMLA ) cream Apply 1 Application topically as needed. 30 g 0   MAGNESIUM PO Take by mouth.     pantoprazole  (PROTONIX ) 40 MG tablet Take 1 tablet (40 mg total) by mouth daily. 30 tablet 3   progesterone  (PROMETRIUM ) 200 MG capsule Take 200 mg by mouth daily.     No current facility-administered medications for this visit.    Allergies as of 11/17/2023 - Review Complete 11/17/2023  Allergen Reaction Noted   Codeine Nausea And Vomiting 01/07/2019    Family History  Problem Relation Age of Onset   Lung cancer Mother    Endometrial cancer Mother    Allergic rhinitis Father    Heart attack Father    Depression Father    Hyperlipidemia Father    Allergic rhinitis Brother    Diabetes Brother    Hyperlipidemia Brother    HIV/AIDS Brother    Breast cancer Maternal Aunt    Breast cancer Cousin    Asthma Son    Allergies Son    Eczema Neg Hx    Urticaria Neg Hx     Social History   Tobacco Use   Smoking status: Former    Current packs/day: 0.00    Average packs/day: 0.5 packs/day for 15.0 years (7.5 ttl pk-yrs)    Types: Cigarettes    Start date: 53    Quit date: 1993    Years since quitting: 32.7    Passive exposure: Never    Smokeless tobacco: Never  Vaping Use   Vaping status: Never Used  Substance Use Topics   Alcohol use: Yes    Comment: 2-3 a month   Drug use: No     Review of Systems:    Constitutional: No weight loss, fever, chills Cardiovascular: No chest pain, chest pressure or palpitations   Respiratory: No SOB or cough Gastrointestinal: See HPI and otherwise negative Hematologic: No bleeding or bruising   Physical Exam:  Vital signs: BP 120/70   Ht 5' 1 (1.549 m)   Wt 118 lb (53.5 kg)   BMI 22.30 kg/m   Wt Readings from Last 3 Encounters:  11/17/23 118 lb (53.5 kg)  11/14/23 117 lb (53.1 kg)  09/27/23 117 lb 6 oz (53.2 kg)  Constitutional: Pleasant, well-appearing female in NAD, alert and cooperative Head:  Normocephalic and atraumatic.  Eyes: No scleral icterus.  Respiratory: Respirations even and unlabored. Lungs clear to auscultation bilaterally.  No wheezes, crackles, or rhonchi.  Cardiovascular:  Regular rate and rhythm. No murmurs. No peripheral edema. Gastrointestinal:  Soft, nondistended, nontender. No rebound or guarding. Normal bowel sounds. No appreciable masses or hepatomegaly. Rectal:  Not performed.  Neurologic:  Alert and oriented x4;  grossly normal neurologically.  Skin:   Dry and intact without significant lesions or rashes. Psychiatric: Oriented to person, place and time. Demonstrates good judgement and reason without abnormal affect or behaviors.   RELEVANT LABS AND IMAGING: CBC    Component Value Date/Time   WBC 6.3 10/25/2023 1538   RBC 4.49 10/25/2023 1538   HGB 14.8 10/25/2023 1538   HGB 13.9 10/26/2022 1450   HCT 43.6 10/25/2023 1538   HCT 42.2 10/26/2022 1450   PLT 243 10/25/2023 1538   PLT 228 10/26/2022 1450   MCV 97.1 10/25/2023 1538   MCV 98 (H) 10/26/2022 1450   MCH 33.0 10/25/2023 1538   MCHC 33.9 10/25/2023 1538   RDW 11.5 10/25/2023 1538   RDW 11.7 10/26/2022 1450   LYMPHSABS 2.1 10/26/2022 1450   MONOABS 0.8 05/27/2019 0932    EOSABS 0.0 10/26/2022 1450   BASOSABS 0.0 10/26/2022 1450    CMP     Component Value Date/Time   NA 141 10/25/2023 1538   NA 142 10/26/2022 1450   K 4.4 10/25/2023 1538   CL 104 10/25/2023 1538   CO2 24 10/25/2023 1538   GLUCOSE 103 (H) 10/25/2023 1538   BUN 17 10/25/2023 1538   BUN 19 10/26/2022 1450   CREATININE 0.78 10/25/2023 1538   CALCIUM 9.6 10/25/2023 1538   PROT 7.2 10/25/2023 1538   PROT 6.7 10/26/2022 1450   ALBUMIN 4.5 10/25/2023 1538   ALBUMIN 4.2 10/26/2022 1450   AST 20 10/25/2023 1538   ALT 18 10/25/2023 1538   ALKPHOS 54 10/25/2023 1538   BILITOT 0.7 10/25/2023 1538   BILITOT 0.5 10/26/2022 1450   GFRNONAA >60 10/25/2023 1538   GFRAA >60 05/27/2019 0932     Assessment/Plan:   RUQ abdominal pain Patient seen today for follow-up of right-sided abdominal pain.  Has had extensive workup including normal RUQ ultrasound, normal HIDA scan, normal CT scan.  Had EGD/colonoscopy 11/14/2023.  On EGD found to have 3 cm hiatal hernia and otherwise normal.  On colonoscopy found to have 2 polyps as well as hemorrhoids.  Pathology pending.  Advised to have repeat colonoscopy in 3 years. She is overall feeling better, having less frequent abdominal pain.  Pain seems to improve with heat.  Has noticed possible dietary association with dairy/whey.  Would like to try low FODMAP diet.  - Await pathology results - Continue conservative measures for pain relief including application of heat, and use of Gas-X as needed. - Will discontinue Protonix  as she is not having reflux or heartburn and EGD did not show any evidence of esophagitis or gastritis. - Trial low FODMAP diet - Follow-up in 6 months   Camie Furbish, PA-C [facility] 11/17/2023, 9:15 AM  Patient Care Team: Armand Sonny HERO, MD as PCP - General (Obstetrics and Gynecology) Court Pulling, MD as Referring Physician (Dermatology) Hansen-Lindner, Sonny HERO, MD (Obstetrics and Gynecology)

## 2023-11-17 ENCOUNTER — Ambulatory Visit: Admitting: Gastroenterology

## 2023-11-17 VITALS — BP 120/70 | Ht 61.0 in | Wt 118.0 lb

## 2023-11-17 DIAGNOSIS — R1011 Right upper quadrant pain: Secondary | ICD-10-CM | POA: Diagnosis not present

## 2023-11-17 LAB — SURGICAL PATHOLOGY

## 2023-11-17 NOTE — Patient Instructions (Signed)
 Stop Protonix  (Pantoprazole ).  Continue GasX as needed.  We are giving you a Low-FODMAP diet handout today. FODMAPs are short-chain carbohydrates (sugars) that are highly fermentable, which means that they go through chemical changes in the GI system, and are poorly absorbed during digestion. When FODMAPs reach the colon (large intestine), bacteria ferment these sugars, turning them into gas and chemicals. This stretches the walls of the colon, causing abdominal bloating, distension, cramping, pain, and/or changes in bowel habits in many patients with IBS. FODMAPs are not unhealthy or harmful, but may exacerbate GI symptoms in those with sensitive GI tracts.   Follow up in 6 months or earlier if needed.  Thank you for trusting me with your gastrointestinal care!   Camie Furbish, PA-C  _______________________________________________________  If your blood pressure at your visit was 140/90 or greater, please contact your primary care physician to follow up on this.  _______________________________________________________  If you are age 69 or older, your body mass index should be between 23-30. Your Body mass index is 22.3 kg/m. If this is out of the aforementioned range listed, please consider follow up with your Primary Care Provider.  If you are age 39 or younger, your body mass index should be between 19-25. Your Body mass index is 22.3 kg/m. If this is out of the aformentioned range listed, please consider follow up with your Primary Care Provider.   ________________________________________________________  The [facility] providers would like to encourage you to use MYCHART to communicate with providers for non-urgent requests or questions.  Due to long hold times on the telephone, sending your provider a message by [facility] may be a faster and more efficient way to get a response.  Please allow 48 business hours for a response.  Please remember that this is for non-urgent requests.   _______________________________________________________  [facility] is using a team-based approach to care.  Your team is made up of your doctor and two to three APPS. Our APPS (Nurse Practitioners and Physician Assistants) work with your physician to ensure care continuity for you. They are fully qualified to address your health concerns and develop a treatment plan. They communicate directly with your gastroenterologist to care for you. Seeing the Advanced Practice Practitioners on your physician's team can help you by facilitating care more promptly, often allowing for earlier appointments, access to diagnostic testing, procedures, and other specialty referrals.

## 2023-11-19 ENCOUNTER — Encounter: Payer: Self-pay | Admitting: Gastroenterology

## 2023-11-24 ENCOUNTER — Ambulatory Visit: Payer: Self-pay | Admitting: Gastroenterology

## 2023-12-21 DIAGNOSIS — J029 Acute pharyngitis, unspecified: Secondary | ICD-10-CM | POA: Diagnosis not present

## 2023-12-21 DIAGNOSIS — R051 Acute cough: Secondary | ICD-10-CM | POA: Diagnosis not present

## 2024-01-02 DIAGNOSIS — H40023 Open angle with borderline findings, high risk, bilateral: Secondary | ICD-10-CM | POA: Diagnosis not present
